# Patient Record
Sex: Male | Born: 1960 | Race: Black or African American | Hispanic: No | Marital: Single | Smoking: Current every day smoker
Health system: Southern US, Community
[De-identification: ages and names within clinical notes are randomized; demographics above are authoritative.]

## PROBLEM LIST (undated history)

## (undated) DIAGNOSIS — Z21 Asymptomatic human immunodeficiency virus [HIV] infection status: Secondary | ICD-10-CM

## (undated) DIAGNOSIS — B20 Human immunodeficiency virus [HIV] disease: Secondary | ICD-10-CM

## (undated) DIAGNOSIS — I10 Essential (primary) hypertension: Secondary | ICD-10-CM

---

## 1998-02-05 ENCOUNTER — Encounter: Admission: RE | Admit: 1998-02-05 | Discharge: 1998-02-05 | Payer: Self-pay | Admitting: Internal Medicine

## 1998-02-19 ENCOUNTER — Encounter: Admission: RE | Admit: 1998-02-19 | Discharge: 1998-02-19 | Payer: Self-pay | Admitting: Internal Medicine

## 1998-03-05 ENCOUNTER — Encounter: Admission: RE | Admit: 1998-03-05 | Discharge: 1998-03-05 | Payer: Self-pay | Admitting: Internal Medicine

## 1998-08-27 ENCOUNTER — Encounter: Admission: RE | Admit: 1998-08-27 | Discharge: 1998-08-27 | Payer: Self-pay | Admitting: Internal Medicine

## 1999-11-19 ENCOUNTER — Encounter: Admission: RE | Admit: 1999-11-19 | Discharge: 1999-11-19 | Payer: Self-pay | Admitting: Hematology and Oncology

## 1999-11-19 ENCOUNTER — Ambulatory Visit (HOSPITAL_COMMUNITY): Admission: RE | Admit: 1999-11-19 | Discharge: 1999-11-19 | Payer: Self-pay | Admitting: Hematology and Oncology

## 1999-11-19 ENCOUNTER — Encounter: Payer: Self-pay | Admitting: Hematology and Oncology

## 2000-03-31 ENCOUNTER — Encounter: Admission: RE | Admit: 2000-03-31 | Discharge: 2000-03-31 | Payer: Self-pay | Admitting: Hematology and Oncology

## 2000-03-31 ENCOUNTER — Ambulatory Visit (HOSPITAL_COMMUNITY): Admission: RE | Admit: 2000-03-31 | Discharge: 2000-03-31 | Payer: Self-pay | Admitting: Hematology and Oncology

## 2000-05-08 ENCOUNTER — Encounter: Admission: RE | Admit: 2000-05-08 | Discharge: 2000-05-08 | Payer: Self-pay | Admitting: Internal Medicine

## 2001-04-13 ENCOUNTER — Emergency Department (HOSPITAL_COMMUNITY): Admission: EM | Admit: 2001-04-13 | Discharge: 2001-04-13 | Payer: Self-pay | Admitting: Emergency Medicine

## 2001-05-22 ENCOUNTER — Emergency Department (HOSPITAL_COMMUNITY): Admission: EM | Admit: 2001-05-22 | Discharge: 2001-05-22 | Payer: Self-pay | Admitting: Emergency Medicine

## 2001-08-20 ENCOUNTER — Encounter: Payer: Self-pay | Admitting: Emergency Medicine

## 2001-08-20 ENCOUNTER — Emergency Department (HOSPITAL_COMMUNITY): Admission: EM | Admit: 2001-08-20 | Discharge: 2001-08-20 | Payer: Self-pay | Admitting: Emergency Medicine

## 2001-08-26 ENCOUNTER — Encounter: Admission: RE | Admit: 2001-08-26 | Discharge: 2001-08-26 | Payer: Self-pay | Admitting: Internal Medicine

## 2001-09-24 ENCOUNTER — Encounter: Admission: RE | Admit: 2001-09-24 | Discharge: 2001-09-24 | Payer: Self-pay | Admitting: Internal Medicine

## 2001-09-25 ENCOUNTER — Encounter: Payer: Self-pay | Admitting: Emergency Medicine

## 2001-09-25 ENCOUNTER — Emergency Department (HOSPITAL_COMMUNITY): Admission: EM | Admit: 2001-09-25 | Discharge: 2001-09-25 | Payer: Self-pay

## 2001-10-20 ENCOUNTER — Ambulatory Visit (HOSPITAL_COMMUNITY): Admission: RE | Admit: 2001-10-20 | Discharge: 2001-10-20 | Payer: Self-pay | Admitting: Internal Medicine

## 2001-10-20 ENCOUNTER — Encounter: Admission: RE | Admit: 2001-10-20 | Discharge: 2001-10-20 | Payer: Self-pay

## 2007-09-10 ENCOUNTER — Emergency Department (HOSPITAL_COMMUNITY): Admission: EM | Admit: 2007-09-10 | Discharge: 2007-09-10 | Payer: Self-pay | Admitting: Family Medicine

## 2007-09-16 ENCOUNTER — Ambulatory Visit: Payer: Self-pay | Admitting: Internal Medicine

## 2007-09-16 ENCOUNTER — Inpatient Hospital Stay (HOSPITAL_COMMUNITY): Admission: EM | Admit: 2007-09-16 | Discharge: 2007-09-25 | Payer: Self-pay | Admitting: Emergency Medicine

## 2007-09-29 ENCOUNTER — Ambulatory Visit: Payer: Self-pay | Admitting: Internal Medicine

## 2007-09-29 DIAGNOSIS — B59 Pneumocystosis: Secondary | ICD-10-CM

## 2007-09-29 DIAGNOSIS — B20 Human immunodeficiency virus [HIV] disease: Secondary | ICD-10-CM

## 2007-09-29 DIAGNOSIS — I059 Rheumatic mitral valve disease, unspecified: Secondary | ICD-10-CM | POA: Insufficient documentation

## 2007-09-29 DIAGNOSIS — Z8709 Personal history of other diseases of the respiratory system: Secondary | ICD-10-CM | POA: Insufficient documentation

## 2007-10-05 ENCOUNTER — Telehealth: Payer: Self-pay | Admitting: Internal Medicine

## 2007-10-06 ENCOUNTER — Inpatient Hospital Stay (HOSPITAL_COMMUNITY): Admission: EM | Admit: 2007-10-06 | Discharge: 2007-10-11 | Payer: Self-pay | Admitting: Emergency Medicine

## 2007-10-11 ENCOUNTER — Telehealth: Payer: Self-pay | Admitting: Internal Medicine

## 2007-11-01 ENCOUNTER — Telehealth: Payer: Self-pay | Admitting: Internal Medicine

## 2007-11-03 ENCOUNTER — Encounter: Admission: RE | Admit: 2007-11-03 | Discharge: 2007-11-03 | Payer: Self-pay | Admitting: Internal Medicine

## 2007-11-03 ENCOUNTER — Ambulatory Visit: Payer: Self-pay | Admitting: Internal Medicine

## 2007-11-03 LAB — CONVERTED CEMR LAB
ALT: 23 units/L (ref 0–53)
Albumin: 3.5 g/dL (ref 3.5–5.2)
Alkaline Phosphatase: 67 units/L (ref 39–117)
BUN: 12 mg/dL (ref 6–23)
Basophils Absolute: 0 10*3/uL (ref 0.0–0.1)
Eosinophils Absolute: 0.6 10*3/uL (ref 0.0–0.7)
Glucose, Bld: 90 mg/dL (ref 70–99)
HCT: 37.7 % — ABNORMAL LOW (ref 39.0–52.0)
HIV-1 RNA Quant, Log: 2.36 — ABNORMAL HIGH (ref <1.70)
Hemoglobin: 12.4 g/dL — ABNORMAL LOW (ref 13.0–17.0)
Lymphocytes Relative: 28 % (ref 12–46)
MCHC: 32.9 g/dL (ref 30.0–36.0)
MCV: 81.3 fL (ref 78.0–100.0)
Neutro Abs: 3.7 10*3/uL (ref 1.7–7.7)
Neutrophils Relative %: 54 % (ref 43–77)
Platelets: 502 10*3/uL — ABNORMAL HIGH (ref 150–400)
RBC: 4.64 M/uL (ref 4.22–5.81)
RDW: 20.2 % — ABNORMAL HIGH (ref 11.5–15.5)
WBC: 6.8 10*3/uL (ref 4.0–10.5)

## 2007-11-15 ENCOUNTER — Telehealth: Payer: Self-pay | Admitting: Internal Medicine

## 2007-11-17 ENCOUNTER — Ambulatory Visit: Payer: Self-pay | Admitting: Internal Medicine

## 2007-11-18 ENCOUNTER — Telehealth: Payer: Self-pay | Admitting: Internal Medicine

## 2007-12-02 ENCOUNTER — Encounter: Payer: Self-pay | Admitting: Internal Medicine

## 2007-12-21 ENCOUNTER — Encounter: Payer: Self-pay | Admitting: Internal Medicine

## 2008-08-31 IMAGING — CR DG CHEST 1V PORT
1 series · 1 of 1 positions shown · non-contrast
Comparison: Portable chest x-ray 09/16/2007.

CLINICAL DATA: Immunocompromised patient with recent history of pneumonia.

PORTABLE CHEST - 1 VIEW  [DATE]/9006 0270 hours:

[view not recorded]
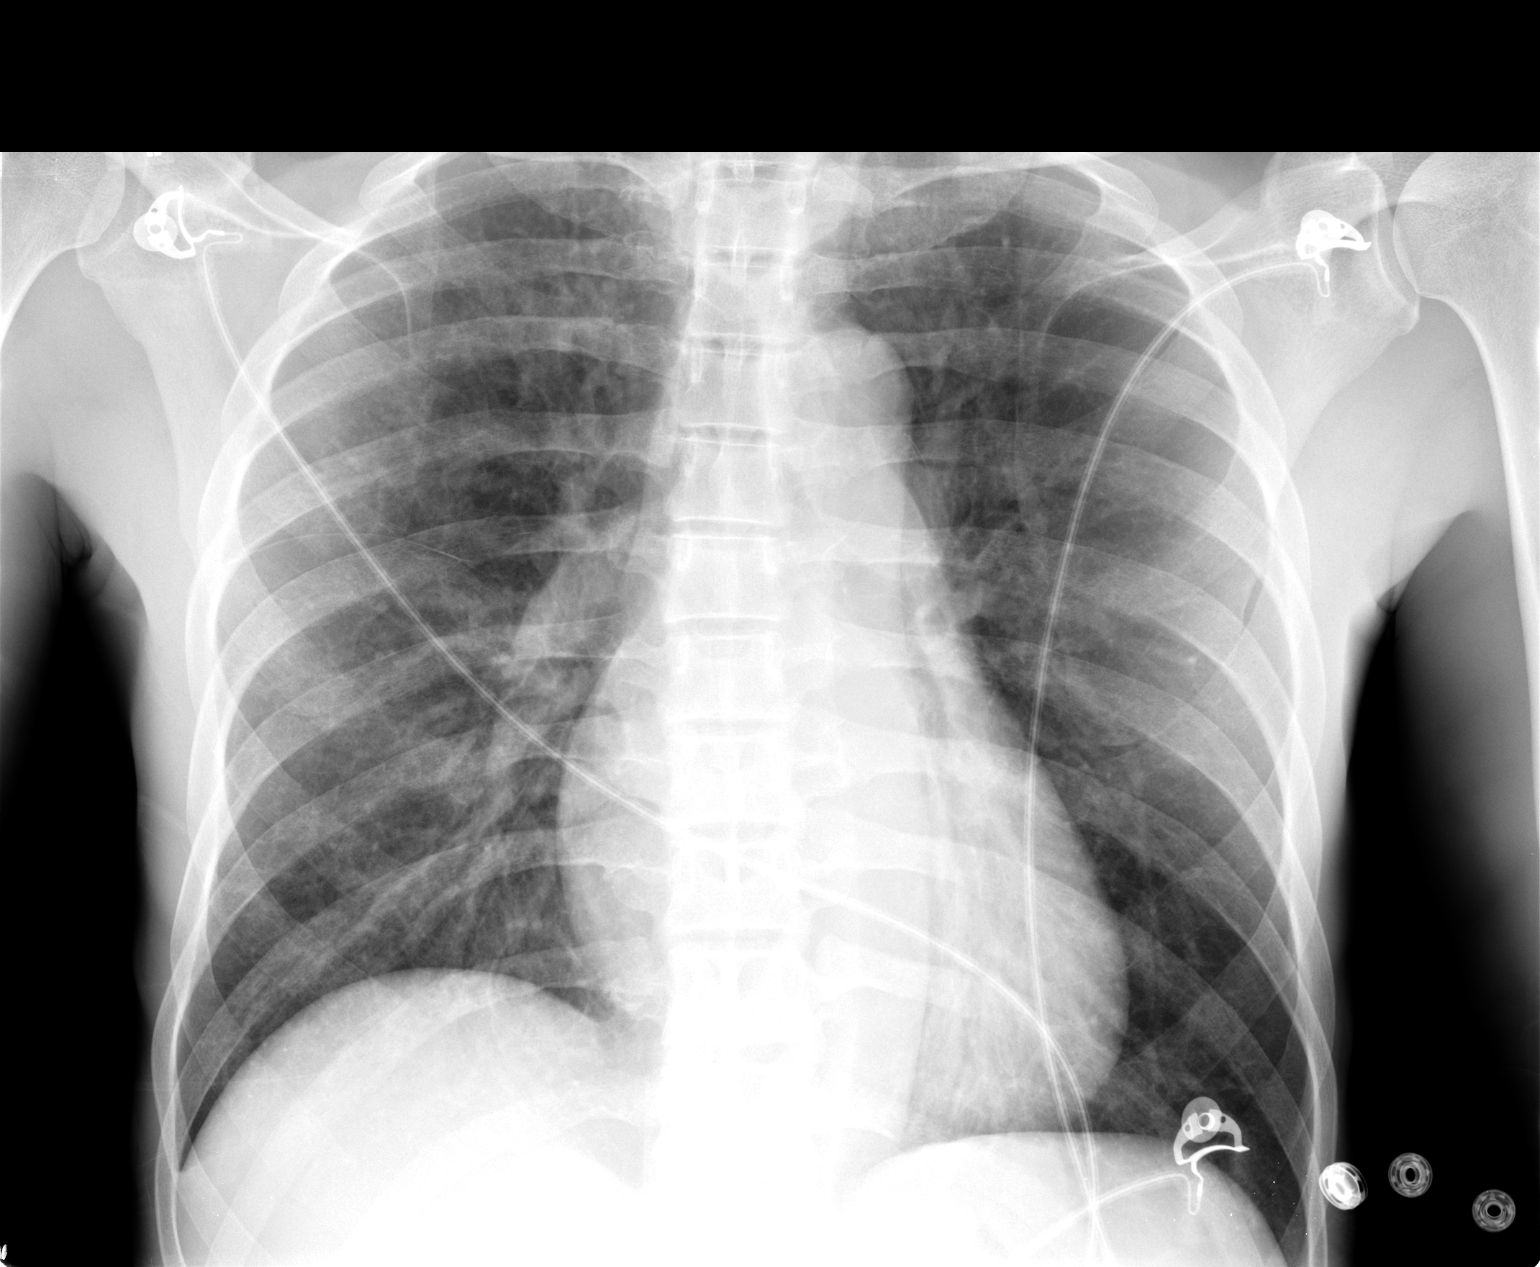

[1 of 1 positions shown; findings below may reference images not displayed]

FINDINGS: Interval resolution of the patchy pneumonia at the right lung base.
Heart size upper normal to slightly enlarged but stable. Pulmonary parenchyma
now clear. No visible pleural effusions.
IMPRESSION: Resolution of right basilar pneumonia since the examination 8 days ago. No acute
cardiopulmonary disease currently.

## 2008-09-12 IMAGING — CR DG ABDOMEN 2V
2 series · 2 of 2 positions shown · non-contrast
Comparison: None.

CLINICAL DATA: Abdominal pain and nausea.  Constipation. 
 ABDOMEN- 2 VIEW:

[w abdomen upright]
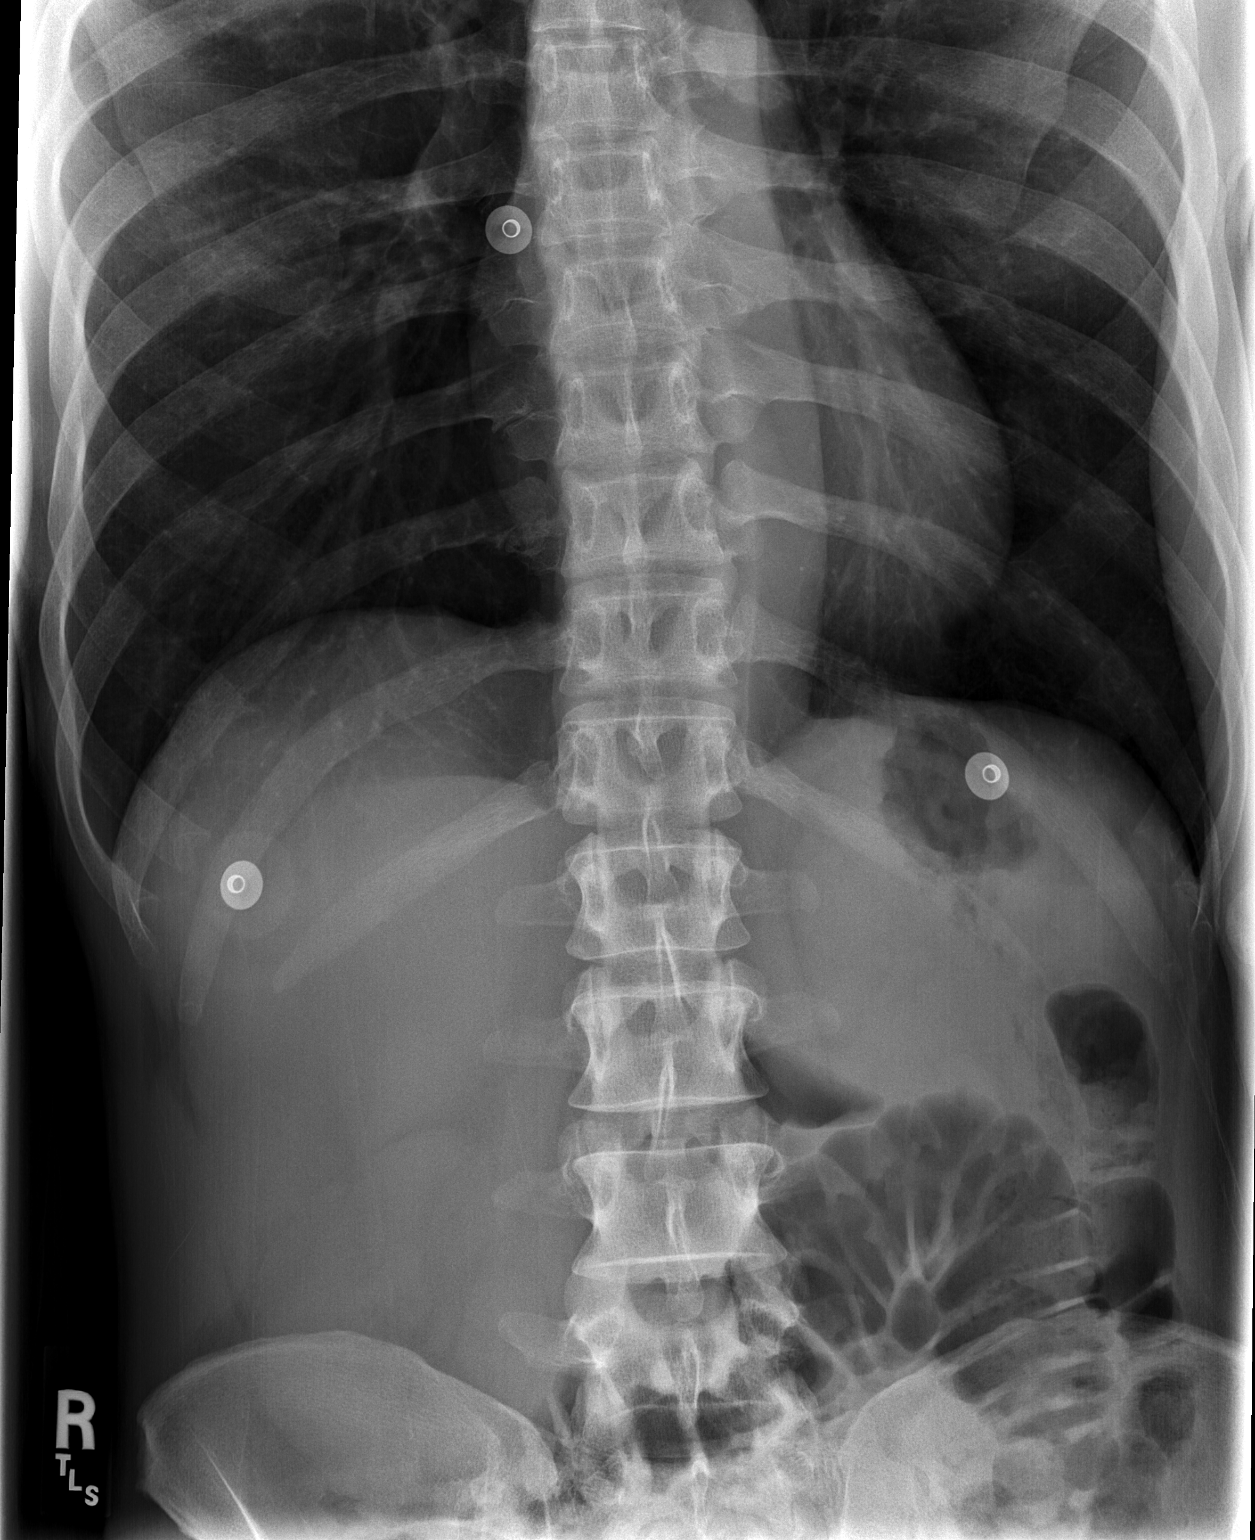

[t abdomen supine]
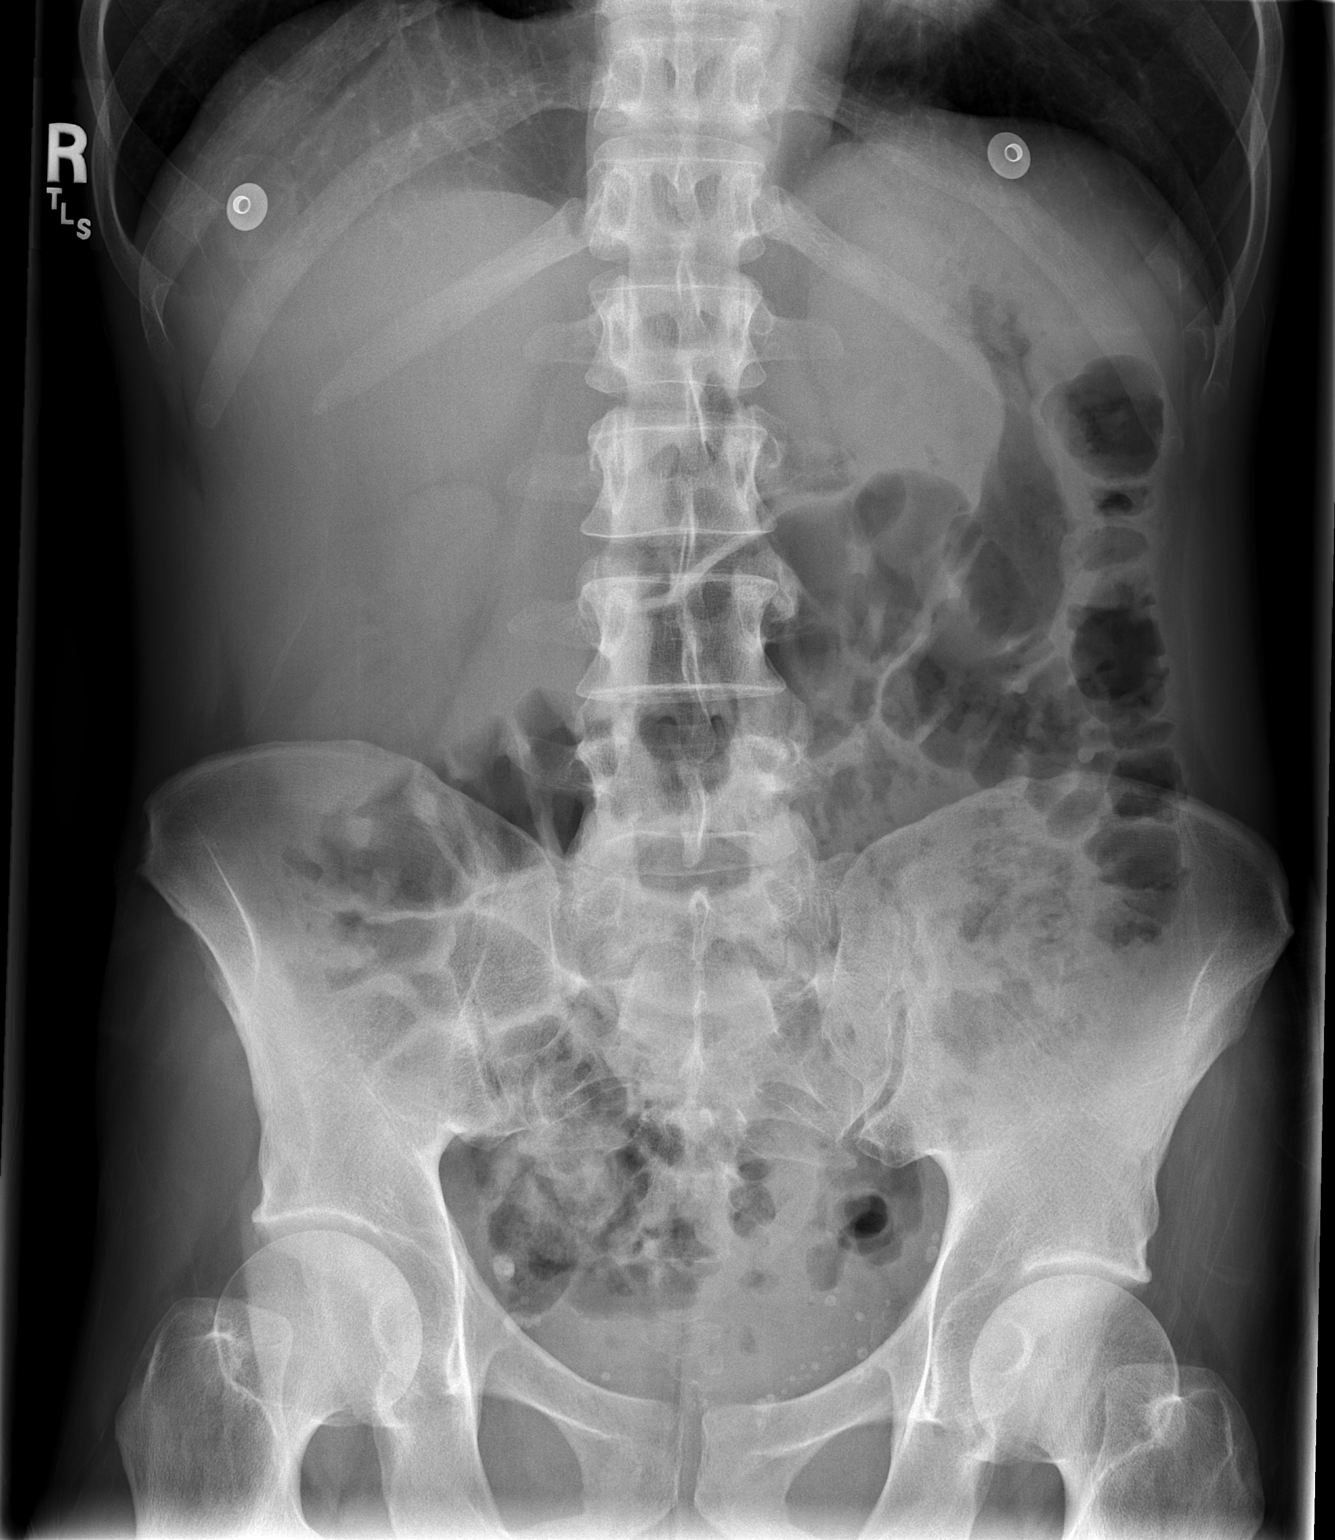

[2 of 2 positions shown; findings below may reference images not displayed]

FINDINGS: The bowel gas pattern is normal.  There does not appear to be an excessive amount of stool in the colon.  Bilateral pelvic calcifications are typical of phleboliths.  There is no evidence of free intraperitoneal air.  The lung bases are clear.
IMPRESSION: No acute abdominal findings. No radiographic evidence of significant constipation.

## 2009-07-31 ENCOUNTER — Ambulatory Visit: Payer: Self-pay | Admitting: Infectious Disease

## 2009-07-31 ENCOUNTER — Inpatient Hospital Stay (HOSPITAL_COMMUNITY): Admission: EM | Admit: 2009-07-31 | Discharge: 2009-08-05 | Payer: Self-pay | Admitting: Emergency Medicine

## 2011-01-05 ENCOUNTER — Inpatient Hospital Stay (INDEPENDENT_AMBULATORY_CARE_PROVIDER_SITE_OTHER)
Admission: RE | Admit: 2011-01-05 | Discharge: 2011-01-05 | Disposition: A | Discharge status: Home / Self Care | Payer: Medicare Other | Source: Ambulatory Visit | Attending: Family Medicine | Admitting: Family Medicine

## 2011-01-05 DIAGNOSIS — N390 Urinary tract infection, site not specified: Secondary | ICD-10-CM

## 2011-01-05 LAB — POCT URINALYSIS DIP (DEVICE)
Glucose, UA: NEGATIVE mg/dL
Ketones, ur: NEGATIVE mg/dL
Nitrite: NEGATIVE
Protein, ur: >=300 mg/dL — AB

## 2011-01-16 LAB — CBC
HCT: 38.7 % — ABNORMAL LOW (ref 39.0–52.0)
HCT: 39.1 % (ref 39.0–52.0)
HCT: 44 % (ref 39.0–52.0)
Hemoglobin: 12.7 g/dL — ABNORMAL LOW (ref 13.0–17.0)
Hemoglobin: 13.1 g/dL (ref 13.0–17.0)
Hemoglobin: 13.2 g/dL (ref 13.0–17.0)
Hemoglobin: 15.1 g/dL (ref 13.0–17.0)
Hemoglobin: 15.4 g/dL (ref 13.0–17.0)
MCHC: 33.7 g/dL (ref 30.0–36.0)
MCHC: 33.8 g/dL (ref 30.0–36.0)
MCHC: 34.4 g/dL (ref 30.0–36.0)
MCV: 90.3 fL (ref 78.0–100.0)
MCV: 90.6 fL (ref 78.0–100.0)
MCV: 90.7 fL (ref 78.0–100.0)
MCV: 90.8 fL (ref 78.0–100.0)
Platelets: 173 10*3/uL (ref 150–400)
Platelets: 209 K/uL (ref 150–400)
Platelets: 286 10*3/uL (ref 150–400)
RBC: 4.09 MIL/uL — ABNORMAL LOW (ref 4.22–5.81)
RBC: 4.13 MIL/uL — ABNORMAL LOW (ref 4.22–5.81)
RBC: 4.28 MIL/uL (ref 4.22–5.81)
RBC: 4.34 MIL/uL (ref 4.22–5.81)
RBC: 4.41 MIL/uL (ref 4.22–5.81)
RBC: 4.87 MIL/uL (ref 4.22–5.81)
RDW: 14 % (ref 11.5–15.5)
RDW: 14.1 % (ref 11.5–15.5)
RDW: 14.4 % (ref 11.5–15.5)
RDW: 14.4 % (ref 11.5–15.5)
WBC: 1.7 10*3/uL — ABNORMAL LOW (ref 4.0–10.5)
WBC: 2.1 10*3/uL — ABNORMAL LOW (ref 4.0–10.5)
WBC: 2.3 10*3/uL — ABNORMAL LOW (ref 4.0–10.5)
WBC: 3 K/uL — ABNORMAL LOW (ref 4.0–10.5)

## 2011-01-16 LAB — BASIC METABOLIC PANEL
BUN: 18 mg/dL (ref 6–23)
CO2: 21 mEq/L (ref 19–32)
CO2: 21 mEq/L (ref 19–32)
CO2: 22 mEq/L (ref 19–32)
CO2: 24 mEq/L (ref 19–32)
Calcium: 7.8 mg/dL — ABNORMAL LOW (ref 8.4–10.5)
Calcium: 8.5 mg/dL (ref 8.4–10.5)
Calcium: 8.5 mg/dL (ref 8.4–10.5)
Calcium: 8.5 mg/dL (ref 8.4–10.5)
Chloride: 100 mEq/L (ref 96–112)
Chloride: 101 mEq/L (ref 96–112)
Chloride: 106 mEq/L (ref 96–112)
Chloride: 99 mEq/L (ref 96–112)
Creatinine, Ser: 0.96 mg/dL (ref 0.4–1.5)
Creatinine, Ser: 0.99 mg/dL (ref 0.4–1.5)
Creatinine, Ser: 1.49 mg/dL (ref 0.4–1.5)
GFR calc Af Amer: >60 mL/min (ref >60)
GFR calc Af Amer: >60 mL/min (ref >60)
GFR calc Af Amer: >60 mL/min (ref >60)
GFR calc Af Amer: >60 mL/min (ref >60)
GFR calc Af Amer: >60 mL/min (ref >60)
GFR calc non Af Amer: 51 mL/min — ABNORMAL LOW (ref >60)
GFR calc non Af Amer: >60 mL/min (ref >60)
GFR calc non Af Amer: >60 mL/min (ref >60)
Glucose, Bld: 100 mg/dL — ABNORMAL HIGH (ref 70–99)
Glucose, Bld: 98 mg/dL (ref 70–99)
Potassium: 4.1 mEq/L (ref 3.5–5.1)
Potassium: 4.2 mEq/L (ref 3.5–5.1)
Potassium: 4.2 mEq/L (ref 3.5–5.1)
Potassium: 4.3 mEq/L (ref 3.5–5.1)
Potassium: 4.3 mEq/L (ref 3.5–5.1)
Potassium: 4.6 mEq/L (ref 3.5–5.1)
Sodium: 132 mEq/L — ABNORMAL LOW (ref 135–145)
Sodium: 133 mEq/L — ABNORMAL LOW (ref 135–145)
Sodium: 133 mEq/L — ABNORMAL LOW (ref 135–145)
Sodium: 137 mEq/L (ref 135–145)

## 2011-01-16 LAB — COMPREHENSIVE METABOLIC PANEL
ALT: 13 U/L (ref 0–53)
ALT: 14 U/L (ref 0–53)
AST: 19 U/L (ref 0–37)
AST: 26 U/L (ref 0–37)
Albumin: 3 g/dL — ABNORMAL LOW (ref 3.5–5.2)
Albumin: 3.6 g/dL (ref 3.5–5.2)
Alkaline Phosphatase: 70 U/L (ref 39–117)
BUN: 16 mg/dL (ref 6–23)
CO2: 24 mEq/L (ref 19–32)
Calcium: 7.7 mg/dL — ABNORMAL LOW (ref 8.4–10.5)
Chloride: 102 mEq/L (ref 96–112)
Chloride: 103 mEq/L (ref 96–112)
Creatinine, Ser: 1.21 mg/dL (ref 0.4–1.5)
Creatinine, Ser: 1.34 mg/dL (ref 0.4–1.5)
GFR calc Af Amer: >60 mL/min (ref >60)
GFR calc Af Amer: >60 mL/min (ref >60)
GFR calc non Af Amer: 57 mL/min — ABNORMAL LOW (ref >60)
Glucose, Bld: 96 mg/dL (ref 70–99)
Potassium: 4.3 mEq/L (ref 3.5–5.1)
Sodium: 133 mEq/L — ABNORMAL LOW (ref 135–145)
Sodium: 136 mEq/L (ref 135–145)
Total Bilirubin: 0.2 mg/dL — ABNORMAL LOW (ref 0.3–1.2)
Total Bilirubin: 0.2 mg/dL — ABNORMAL LOW (ref 0.3–1.2)
Total Protein: 6 g/dL (ref 6.0–8.3)

## 2011-01-16 LAB — PATHOLOGIST SMEAR REVIEW

## 2011-01-16 LAB — TOXOPLASMA GONDII ANTIBODY, IGG: Toxoplasma IgG Ratio: <0.5 IU/mL

## 2011-01-16 LAB — LACTIC ACID, PLASMA: Lactic Acid, Venous: 1 mmol/L (ref 0.5–2.2)

## 2011-01-16 LAB — DIFFERENTIAL
Basophils Absolute: 0 10*3/uL (ref 0.0–0.1)
Basophils Absolute: 0 10*3/uL (ref 0.0–0.1)
Basophils Absolute: 0 K/uL (ref 0.0–0.1)
Basophils Relative: 1 % (ref 0–1)
Basophils Relative: 1 % (ref 0–1)
Basophils Relative: 1 % (ref 0–1)
Eosinophils Absolute: 0 10*3/uL (ref 0.0–0.7)
Eosinophils Absolute: 0 10*3/uL (ref 0.0–0.7)
Eosinophils Absolute: 0.1 10*3/uL (ref 0.0–0.7)
Eosinophils Relative: 0 % (ref 0–5)
Eosinophils Relative: 1 % (ref 0–5)
Eosinophils Relative: 1 % (ref 0–5)
Eosinophils Relative: 2 % (ref 0–5)
Eosinophils Relative: 3 % (ref 0–5)
Lymphocytes Relative: 26 % (ref 12–46)
Lymphocytes Relative: 46 % (ref 12–46)
Lymphocytes Relative: 50 % — ABNORMAL HIGH (ref 12–46)
Lymphocytes Relative: 54 % — ABNORMAL HIGH (ref 12–46)
Lymphs Abs: 0.8 K/uL (ref 0.7–4.0)
Lymphs Abs: 0.9 10*3/uL (ref 0.7–4.0)
Lymphs Abs: 1 10*3/uL (ref 0.7–4.0)
Lymphs Abs: 1.6 10*3/uL (ref 0.7–4.0)
Monocytes Absolute: 0.1 10*3/uL (ref 0.1–1.0)
Monocytes Absolute: 0.2 10*3/uL (ref 0.1–1.0)
Monocytes Absolute: 0.2 10*3/uL (ref 0.1–1.0)
Monocytes Absolute: 0.3 10*3/uL (ref 0.1–1.0)
Monocytes Relative: 10 % (ref 3–12)
Monocytes Relative: 10 % (ref 3–12)
Monocytes Relative: 10 % (ref 3–12)
Monocytes Relative: 5 % (ref 3–12)
Monocytes Relative: 8 % (ref 3–12)
Monocytes Relative: 9 % (ref 3–12)
Neutro Abs: 0.7 10*3/uL — ABNORMAL LOW (ref 1.7–7.7)
Neutro Abs: 0.8 10*3/uL — ABNORMAL LOW (ref 1.7–7.7)
Neutro Abs: 1.9 10*3/uL (ref 1.7–7.7)
Neutrophils Relative %: 32 % — ABNORMAL LOW (ref 43–77)
Neutrophils Relative %: 38 % — ABNORMAL LOW (ref 43–77)
Neutrophils Relative %: 42 % — ABNORMAL LOW (ref 43–77)
Neutrophils Relative %: 64 % (ref 43–77)

## 2011-01-16 LAB — VITAMIN D 1,25 DIHYDROXY
Vitamin D 1, 25 (OH)2 Total: 75 pg/mL — ABNORMAL HIGH (ref 18–72)
Vitamin D2 1, 25 (OH)2: <8 pg/mL
Vitamin D3 1, 25 (OH)2: 75 pg/mL

## 2011-01-16 LAB — MAGNESIUM
Magnesium: 1.9 mg/dL (ref 1.5–2.5)
Magnesium: 2.3 mg/dL (ref 1.5–2.5)

## 2011-01-16 LAB — HEPATITIS PANEL, ACUTE: HCV Ab: NEGATIVE

## 2011-01-16 LAB — CULTURE, BLOOD (ROUTINE X 2)
Culture: NO GROWTH
Culture: NO GROWTH

## 2011-01-16 LAB — URINALYSIS, ROUTINE W REFLEX MICROSCOPIC
Hgb urine dipstick: NEGATIVE
Ketones, ur: 15 mg/dL — AB
Leukocytes, UA: NEGATIVE
Nitrite: NEGATIVE
Protein, ur: 30 mg/dL — AB
Urobilinogen, UA: 0.2 mg/dL (ref 0.0–1.0)

## 2011-01-16 LAB — URINE CULTURE
Colony Count: NO GROWTH
Culture: NO GROWTH

## 2011-01-16 LAB — CSF CELL COUNT WITH DIFFERENTIAL
RBC Count, CSF: 0 /mm3
WBC, CSF: 2 /mm3 (ref 0–5)

## 2011-01-16 LAB — BASIC METABOLIC PANEL WITH GFR
BUN: 18 mg/dL (ref 6–23)
Glucose, Bld: 98 mg/dL (ref 70–99)

## 2011-01-16 LAB — CSF CULTURE W GRAM STAIN
Culture: NO GROWTH
Gram Stain: NONE SEEN

## 2011-01-16 LAB — TESTOSTERONE: Testosterone: 165.8 ng/dL — ABNORMAL LOW (ref 350–890)

## 2011-01-16 LAB — HIV-1 RNA QUANT-NO REFLEX-BLD: HIV 1 RNA Quant: <48 copies/mL (ref <48)

## 2011-01-16 LAB — URINE MICROSCOPIC-ADD ON

## 2011-01-16 LAB — LACTATE DEHYDROGENASE, ISOENZYMES
LDH 1: 17 % (ref 19–38)
LDH 2: 28 % — ABNORMAL LOW (ref 30–43)
LDH 3: 29 % — ABNORMAL HIGH (ref 16–26)

## 2011-01-16 LAB — TSH: TSH: 3.209 u[IU]/mL (ref 0.350–4.500)

## 2011-01-16 LAB — HSV PCR: HSV 2 , PCR: NOT DETECTED

## 2011-01-16 LAB — PHOSPHORUS: Phosphorus: 3.9 mg/dL (ref 2.3–4.6)

## 2011-01-16 LAB — PROTEIN AND GLUCOSE, CSF: Total  Protein, CSF: 35 mg/dL (ref 15–45)

## 2011-01-16 LAB — T-HELPER CELLS (CD4) COUNT (NOT AT ARMC): CD4 % Helper T Cell: 18 % — ABNORMAL LOW (ref 33–55)

## 2011-01-16 LAB — SEDIMENTATION RATE: Sed Rate: 14 mm/hr (ref 0–16)

## 2011-01-16 LAB — T4, FREE: Free T4: 0.69 ng/dL — ABNORMAL LOW (ref 0.80–1.80)

## 2011-02-25 NOTE — H&P (Signed)
NAME:  Eric Gardner, Eric Gardner                ACCOUNT NO.:  [id number]   MEDICAL RECORD NO.:  [id number]          PATIENT TYPE:  INP   LOCATION:  5522                         FACILITY:  [facility]   PHYSICIAN:  Wilson Singer, M.D.DATE OF BIRTH:  17-Jul-1961   DATE OF ADMISSION:  10/06/2007  DATE OF DISCHARGE:                              HISTORY & PHYSICAL   This is a very pleasant 50 year old African American gentleman who has a  background history of HIV disease and was recently hospitalized for  presumed a PCP right lower lobe pneumonia and was discharged home on  Bactrim DS and now presents with about a one week history of generalized  abdominal pain, anorexia, constipation for the last three to four days.  He denies any rectal bleeding, hematemesis.  There are no urinary  symptoms.  He was started on HIV medications I believe on his last  admission.   PAST MEDICAL HISTORY:  HIV diagnosed approximately 10 years ago.  History of anemia probably related to his HIV.  History of mitral valve  prolapse.  History of recent presumed PCP pneumonia, although sputum  actually was negative.   SOCIAL HISTORY:  Single, lives with his family.  He is a nonsmoker, does  not drink alcohol.  He is currently unemployed.   PAST SURGICAL HISTORY:  None.   MEDICATIONS:  Atripla one tablet daily, Bactrim DS two tablets t.i.d.   ALLERGIES:  None   FAMILY HISTORY:  Noncontributory.   REVIEW OF SYSTEMS:  Apart from symptoms mentioned above there are no  other symptoms with all review of systems reviewed.   PHYSICAL EXAMINATION:  VITAL SIGNS:  Temperature 97.4, blood pressure  121/88, pulse 80, respirations 12-14, pulse ox 97% on room air.  GENERAL:  He looks somewhat clinically dehydrated.  CARDIOVASCULAR:  Heart sounds are present and normal without murmurs.  RESPIRATORY:  Lung fields are clear.  ABDOMEN:  Soft and voluntary guarding.  There is minimal tenderness and  there is no rebound tenderness.  Bowel  sounds are heard but are scanty.  NEUROLOGICAL:  He is alert and oriented with no focal neurologic signs.   INVESTIGATIONS:  Sodium 126, potassium 6.1, bicarbonate 25, BUN 29,  creatinine 1.61, hemoglobin 16.1, white blood cell count 4.4, platelets  246.   IMPRESSION:  1. Abdominal pain, unclear etiology, possibly related to constipation.  2. Renal failure secondary to hypovolemia and dehydration.  3. Hyponatremia secondary to #2.  4. Human immunodeficiency virus disease, on treatment.  5. Recent right lower lobe pneumonia, on Bactrim.   PLAN:  1. Admit.  2. Intravenous fluids.  3. CT of the abdomen for further evaluation of the abdominal pain.   Further recommendations will depend on the patient's hospital progress.      Wilson Singer, M.D.  Electronically Signed     NCG/MEDQ  D:  10/06/2007  T:  10/07/2007  Job:  [id number]

## 2011-02-25 NOTE — Discharge Summary (Signed)
NAMEWENTWORTH, EDELEN                ACCOUNT NO.:  [id number]   MEDICAL RECORD NO.:  [id number]          PATIENT TYPE:  INP   LOCATION:  6713                         FACILITY:  [facility]   PHYSICIAN:  Michaelyn Barter, M.D. DATE OF BIRTH:  August 15, 1961   DATE OF ADMISSION:  09/16/2007  DATE OF DISCHARGE:                               DISCHARGE SUMMARY   PRIMARY CARE PHYSICIAN:  Unassigned.   FINAL DIAGNOSES:  1. Right lower lobe pneumonia.  2. History of human immunodeficiency virus positive/AIDS.  3. Thrush.  4. Weakness, deconditioning.  5. Hyperkalemia.  6. Anemia.   PROCEDURE:  Chest x-ray completed September 16, 2007.   CONSULTATIONS:  Infectious disease with Dr. Cliffton Asters.   HISTORY OF PRESENT ILLNESS:  Mr. Brannan is a 50 year old gentleman with  a past medical history of HIV who arrived complaining of progressive  shortness of breath as well as fever that had occurred over the 3 weeks  leading up to his admission.  He also complained of chronic diarrhea.  He was seen in the emergency department on Saturday and was diagnosed  with an upper respiratory tract infection.  Amoxicillin was started.  He  indicates that his symptoms did not improve but progressed.   For past medical history please see that dictated by Dr. Oneita Hurt.   HOSPITAL COURSE:  1. Pneumonia.  The patient had a chest x-ray completed on September 16, 2007 which revealed patchy opacity at the right lung base, possibly      early pneumonia.  He was started on Rocephin 2 grams IV daily as      well as azithromycin 500 mg IV daily.  Because of his history of      HIV and his presentation, Bactrim DS was initiated out of concerns      of PCP.  Over the course of the patient's hospitalization his      breathing has improved although he still complains of a significant      amount of shortness of breath with exertion.  Infectious disease      was consulted and Dr. Cliffton Asters has been following the patient.      The patient has been started on prednisone and after 6 days of IV      Rocephin and azithromycin, both IV Rocephin and azithromycin have      been discontinued.  The patient however remains on Septra DS two      tablets p.o. t.i.d. out of concern that the patient may be      suffering from Pneumocystis carinii pneumonia.  Dr. Orvan Falconer has      indicated that he believes that the patient may have PCP.  Sputum      has been sent for PCP and determined to be negative however.  2. Human immunodeficiency virus positive/AIDS.  The patient had an HIV-      1 RNA quant completed, the result of which was 121,000. Likewise      and HIV-1 RNA quant, LOG was 5.08.  The patient has been started on  MAC prophylaxis with azithromycin 1200 mg every Wednesday.  3. Oral thrush. Diflucan has been provided.  4. Severe weakness, deconditioning.  The patient appears to be      cachectic in his appearance.  His oral intake has been decreased.      I suspect the patient's weakness and deconditioning are      multifactorial, including the pneumonia, his overall decreased oral      intake, as well as his cachectic appearance.  Physical therapy plus      occupational therapy may need to be consulted prior to patient's      discharge from the hospital.  The patient also has been limited in      his activity level secondary to becoming significantly short of      breath as he tries to exert himself.  5. Hyperkalemia.  Patient's potassium level was noted to be 5.2 on      September 22, 2007.  For this, Kayexalate was given.  The patient's      hyperkalemia resolved.  6. Mild anemia.  This has simply been monitored over the course of his      hospitalization.   This brings me up to September 24, 2007.  I suspect that the patient may  be discharged from the hospital soon.      Michaelyn Barter, M.D.  Electronically Signed     OR/MEDQ  D:  09/23/2007  T:  09/23/2007  Job:  [id number]

## 2011-02-25 NOTE — Discharge Summary (Signed)
NAMEGABRIELE, LOVELAND                ACCOUNT NO.:  [id number]   MEDICAL RECORD NO.:  [id number]          PATIENT TYPE:  INP   LOCATION:  5522                         FACILITY:  [facility]   PHYSICIAN:  Herbie Saxon, MDDATE OF BIRTH:  12-05-1960   DATE OF ADMISSION:  10/06/2007  DATE OF DISCHARGE:  10/11/2007                               DISCHARGE SUMMARY   DISCHARGE DIAGNOSES:  1. Human immunodeficiency virus (HIV) positive.  2. Dehydration.  3. Acute renal failure, resolved.  4. Hyponatremia, improved.  5. Recent treatment for Pneumocystis carinii (PCP) pneumonia.  6. Constipation, resolved.  7. Protein-calorie malnutrition.  8. History of oral Candidiasis.  9. Recent treatment for right lower lobe pneumonia.   RADIOLOGY:  The chest x-ray on October 06, 2007, shows mild apical  pleural thickening.  The CT abdomen of October 07, 2007, shows no acute  findings in the abdomen, moderate pulmonary cysts, and ponderable  emphysema.  Abdominal x-ray of December 24th, which showed no acute  abdominal findings.   HOSPITAL COURSE:  This 50 year old, African-American gentleman presented  to the emergency room with generalized abdominal pain, anorexia, nausea,  constipation for the last three days, no rectal bleeding, no  hematemesis.  On evaluation, the patient was noticed to be acutely  dehydrated with severe hyponatremia with a sodium level of 127 at  inception.  His stool workup was negative, and the C. diff toxin was  also negative.  He was started on IV fluid hydration with normal saline  and was also placed on Diflucan.  His nausea has improved, and the  patient has no more constipation.  He complains of some right arm pain,  which is relieved with p.r.n. Percocet.   The patient did have diarrhea, which has resolved in the last 48 hours,  and this was present at inception.   CONDITION ON DISCHARGE:  Stable.   DIET:  Regular.   ACTIVITY:  No restrictions; the patient is  to increase it slowly as  tolerated.   FOLLOWUP:  Dr. Drue Second in [facility] to have a CD-4  count, and the patient will have a repeat BNP at that time.   MEDICATIONS ON DISCHARGE:  1. Bactrim Double Strength 2 tablets b.i.d. for one week.  2. Diflucan 100 mg p.o. daily.  3. Reglan 10 mg q.8h p.r.n.  4. Percocet 5/325 mg one q.6h p.r.n.  5. Ensure one can p.o. b.i.d.  6. Zithromax 1250 mg weekly.  7. Sodium chloride 1 g b.i.d. for 2 days.  8. Atripla one tablet daily.   PHYSICAL EXAMINATION:  GENERAL:  He is a middle-aged man not in acute  distress.  VITAL SIGNS:  The temperature is 98, the pulse is 63, respiratory rate  is 20, blood pressure 136/78.  HEENT:  Pupils are equal and reactive to light and accommodation,  extraocular muscles are intact.  Head is atraumatic, normocephalic.  NECK:  There is no neck stiffness.  CHEST:  Clinically clear.  CARDIAC:  Heart sounds 1 and 2, regular rate and rhythm.  ABDOMEN:  Scaphoid, soft and nontender, no  organomegaly.  NEURO:  He is alert and oriented x3.  EXTREMITIES:  Peripheral pulses are present and no pedal edema.   LABORATORY DATA:  The available labs show the stool culture negative.  Sodium is 134, potassium 4.2, chloride 102, bicarbonate 25, BUN 10,  creatinine 1.1, glucose is 78.  BSR is 22.  WBC is 3.6, hematocrit 39,  platelet count is 219.      Herbie Saxon, MD  Electronically Signed     MIO/MEDQ  D:  10/11/2007  T:  10/11/2007  Job:  [id number]

## 2011-02-25 NOTE — H&P (Signed)
NAMETREVOR, Eric Gardner                ACCOUNT NO.:  [id number]   MEDICAL RECORD NO.:  [id number]           PATIENT TYPE:   LOCATION:                                 FACILITY:   PHYSICIAN:  Madaline Savage, MD        DATE OF BIRTH:  July 11, 1961   DATE OF ADMISSION:  DATE OF DISCHARGE:                              HISTORY & PHYSICAL   PRIMARY CARE PHYSICIAN:  At [place], [place]. This patient is  unassigned to [place].   CHIEF COMPLAINT:  Shortness of breath and fever.   HISTORY OF PRESENT ILLNESS:  Eric Gardner is a 50 year old gentleman with  history of HIV for many years who comes in with progressively worsening  shortness of breath and fever for the last three weeks. His problems  started approximately three weeks ago when he started having shortness  of breath and mild cough. His breathing has gotten progressively  worsened. He started feeling chilly, and of late, he has been feverish.  He also complains of diarrhea which is chronic. He came to the ED on  Saturday, and at that time, he was evaluated and diagnosed with an upper  respiratory infection and was started on amoxicillin. He has not felt  any better since he was started on amoxicillin. His breathing is now  much worse, and he had a fever of 104 today.   PAST MEDICAL HISTORY:  1. HIV positive. He says he has been HIV positive for approximately 10      years. He states he has never been put on antiretroviral therapy.      He does not know his CD4 count or viral load. He follows up with a      doctor in [place]. He has had one episode of pneumonia in the      past.  2. Mitral valve prolapse.   PAST SURGICAL HISTORY:  None.   ALLERGIES:  None.   CURRENT MEDICATIONS:  Amoxicillin by mouth for the last 5 days.   SOCIAL HISTORY:  He lives with a roommate. He does not work. He smokes  approximately 1 pack of cigarettes a week, and he states he has been  smoking for the last 20 years. Denies any history of alcohol or  drug  abuse.   FAMILY HISTORY:  His father died in the 94s. He had diabetes, and he  died of heart failure. His mother passed away in her 58s. She also had  diabetes, and she passed away from kidney failure and its complications.   REVIEW OF SYSTEMS:  GENERAL:  He denies any recent weight loss or weight  gain. He does complain of fever and chills. He also states he has had  poor appetite for the last few days. HEENT:  Denies headaches, blurred  vision. He complains of sore throat. CARDIOVASCULAR:  Denies chest pain  or palpitations. RESPIRATORY SYSTEM:  He does have shortness of breath  and cough. GASTROINTESTINAL:  No abdominal pain, nausea, vomiting but  does have chronic diarrhea.   PHYSICAL EXAMINATION:  He is alert and oriented x3.  VITAL SIGNS:  Temperature is 104.8, pulse is 98 per minute, blood  pressure 144/87, respiratory rate of 13 per minute, oxygen saturation  94% on room air.  HEENT:  Head atraumatic and normocephalic. Pupils are equal, round, and  reactive to light. Mucous membranes are moist.  NECK:  Is supple. No JVD. No carotid bruits.  CARDIOVASCULAR SYSTEM:  S1 and S2 heard. Regular rhythm.  CHEST:  There was few crackles heard at bilateral basis with coarse  breath sounds.  ABDOMEN:  Soft, nontender. Bowel sounds heard.  EXTREMITIES:  No clubbing, cyanosis, or edema.   LABORATORY DATA:  Show a white count of 7.4, hemoglobin 11.7, platelets  380. Sodium 136, potassium 3.7, chloride 102, bicarbonate 27, BUN 12,  creatinine 1, glucose 97. His urinalysis is negative. He had an x-ray of  chest which showed a questionable right base opacity.   IMPRESSION:  1. Right lower lobe pneumonia in a HIV positive patient.  2. Human immunodeficiency virus positive.   PLAN:  This is a 50 year old gentleman who has a history of HIV positive  whose CD4 count and viral load are not known who comes in with what  appears like a right lower lobe pneumonia. His O2 saturations were  as  low as 91% on room air on admission, and one of the primary differential  diagnosis is Pneumocystis carinii pneumonia.  I will admit him to the  step-down bed at this time and watch his respiratory status closely. I  will get an ABG on room air, and I will start him empirically on  Bactrim, Rocephin and Zithromax. I will consult infectious disease  doctor to help with antibiotics. I will also get a CD4 count and a viral  load on him. I will also get PPD on him and keep him in isolation at  this time. I will put him on DVT prophylaxis.      Madaline Savage, MD  Electronically Signed     PKN/MEDQ  D:  09/16/2007  T:  09/16/2007  Job:  [id number]

## 2011-02-25 NOTE — Discharge Summary (Signed)
Eric Gardner, Eric Gardner                ACCOUNT NO.:  [id number]   MEDICAL RECORD NO.:  [id number]          PATIENT TYPE:  INP   LOCATION:  6713                         FACILITY:  [facility]   PHYSICIAN:  Lonia Blood, M.D.DATE OF BIRTH:  [birth year]   DATE OF ADMISSION:  09/16/2007  DATE OF DISCHARGE:  09/25/2007                               DISCHARGE SUMMARY   ADDENDUM   PRIMARY CARE PHYSICIAN:  Tresa Endo L. Philipp Deputy, M.D.   This is an addendum to a previously dictated discharge summary.  For  details concerning the patient's full acute hospitalization, please see  discharge summary [phone] per Dr. Roxan Hockey.   DISCHARGE DIAGNOSES:  1. Right lower lobe pneumonia, clinically consistent with Pneumocystis      carinii pneumonia.  2. Human immunodeficiency virus positive/acquired immunodeficiency      syndrome.  3. Oral candidiasis.  4. Deconditioning/weakness.  5. Anemia.  6. Hyperkalemia, resolved.   DISCHARGE MEDICATIONS:  1. Bactrim DS 2 tablets three times a day through October 06, 2007.  2. Azithromycin 60 mg 2 tablets each Wednesday.  3. Prednisone 10 mg 2 tablets daily through August 06, 2007.  4. Diflucan 100 mg one pill each week on Wednesday.   FOLLOW UP:  The patient is to followup in the [facility] with Dr. Philipp Deputy on September 29, 2007, at 10:45 a.m.   HOSPITAL COURSE:  For details concerning the patient's acute  hospitalization, please see previously dictated discharge summary.  The  patient's hospitalization was extended from September 23, 2007, through  September 25, 2007, while attempts were made to procure medications for  the patient and headway could be made in regard to his deconditioning.  By September 25, 2007, the ID clinic had provided the patient with enough  samples of his medication to last him through his followup clinic visit.  Additionally, his deconditioning had improved to the  point he was stable for discharge home.  On September 25, 2007, the  patient was afebrile with stable vital signs.  He had no resting  shortness of breath and his exercise tolerance was a reasonable.  He was  cleared for discharge home in an afebrile state tolerating a regular  diet.      Lonia Blood, M.D.  Electronically Signed     JTM/MEDQ  D:  09/25/2007  T:  09/27/2007  Job:  [id number]   cc:   Tresa Endo L. Philipp Deputy, M.D.

## 2011-07-18 LAB — BASIC METABOLIC PANEL
BUN: 11
CO2: 24
Chloride: 102
Chloride: 106
Creatinine, Ser: 1.11
Creatinine, Ser: 1.15
GFR calc Af Amer: >60
Glucose, Bld: 87
Potassium: 4.2
Sodium: 134 — ABNORMAL LOW

## 2011-07-18 LAB — I-STAT 8, (EC8 V) (CONVERTED LAB)
BUN: 34 — ABNORMAL HIGH
Bicarbonate: 27.1 — ABNORMAL HIGH
Chloride: 98
Glucose, Bld: 94
Hemoglobin: 18.7 — ABNORMAL HIGH
Operator id: 294521
pCO2, Ven: 52.3 — ABNORMAL HIGH
pH, Ven: 7.322 — ABNORMAL HIGH

## 2011-07-18 LAB — URINALYSIS, ROUTINE W REFLEX MICROSCOPIC
Bilirubin Urine: NEGATIVE
Nitrite: NEGATIVE
Specific Gravity, Urine: 1.025
Urobilinogen, UA: 0.2
pH: 6

## 2011-07-18 LAB — CBC
HCT: 39.5
HCT: 48.9
Hemoglobin: 13.3
Hemoglobin: 16.1
MCV: 81.3
Platelets: 246
RDW: 17.5 — ABNORMAL HIGH
WBC: 4.4

## 2011-07-18 LAB — COMPREHENSIVE METABOLIC PANEL
ALT: 36
Alkaline Phosphatase: 54
BUN: 21
CO2: 25
Calcium: 10.3
Chloride: 100
Chloride: 92 — ABNORMAL LOW
Creatinine, Ser: 1.24
Creatinine, Ser: 1.61 — ABNORMAL HIGH
GFR calc non Af Amer: 46 — ABNORMAL LOW
Glucose, Bld: 118 — ABNORMAL HIGH
Glucose, Bld: 97
Potassium: 4.6
Total Bilirubin: 0.8
Total Bilirubin: 1
Total Protein: 6.3

## 2011-07-18 LAB — CLOSTRIDIUM DIFFICILE EIA: C difficile Toxins A+B, EIA: NEGATIVE

## 2011-07-18 LAB — FECAL LACTOFERRIN, QUANT

## 2011-07-18 LAB — OSMOLALITY: Osmolality: 273 — ABNORMAL LOW

## 2011-07-18 LAB — POCT I-STAT CREATININE
Creatinine, Ser: 1.9 — ABNORMAL HIGH
Operator id: 294521

## 2011-07-18 LAB — DIFFERENTIAL
Basophils Absolute: 0
Eosinophils Relative: 1
Lymphocytes Relative: 14
Lymphs Abs: 0.6 — ABNORMAL LOW
Neutro Abs: 3.2

## 2011-07-18 LAB — URINE MICROSCOPIC-ADD ON

## 2011-07-18 LAB — GIARDIA/CRYPTOSPORIDIUM SCREEN(EIA): Giardia Screen - EIA: NEGATIVE

## 2011-07-18 LAB — SEDIMENTATION RATE: Sed Rate: 22 — ABNORMAL HIGH

## 2011-07-21 LAB — BASIC METABOLIC PANEL
BUN: 10
BUN: 12
BUN: 8
BUN: 9
CO2: 22
CO2: 23
CO2: 26
Calcium: 7.6 — ABNORMAL LOW
Calcium: 8 — ABNORMAL LOW
Calcium: 8.1 — ABNORMAL LOW
Calcium: 8.4
Calcium: 8.5
Chloride: 103
Chloride: 105
Chloride: 106
Chloride: 108
Creatinine, Ser: 0.76
Creatinine, Ser: 0.77
Creatinine, Ser: 0.81
Creatinine, Ser: 0.98
GFR calc Af Amer: >60
GFR calc Af Amer: >60
GFR calc Af Amer: >60
GFR calc non Af Amer: >60
GFR calc non Af Amer: >60
GFR calc non Af Amer: >60
GFR calc non Af Amer: >60
Glucose, Bld: 106 — ABNORMAL HIGH
Glucose, Bld: 116 — ABNORMAL HIGH
Glucose, Bld: 141 — ABNORMAL HIGH
Glucose, Bld: 76
Glucose, Bld: 92
Potassium: 3.8
Potassium: 3.8
Potassium: 4.1
Potassium: 5.2 — ABNORMAL HIGH
Sodium: 135
Sodium: 136

## 2011-07-21 LAB — DIFFERENTIAL
Basophils Absolute: 0
Basophils Absolute: 0
Basophils Absolute: 0
Basophils Absolute: 0
Basophils Absolute: 0
Basophils Absolute: 0
Basophils Absolute: 0.2 — ABNORMAL HIGH
Basophils Relative: 0
Basophils Relative: 0
Basophils Relative: 0
Basophils Relative: 1
Basophils Relative: 3 — ABNORMAL HIGH
Eosinophils Absolute: 0 — ABNORMAL LOW
Eosinophils Relative: 0
Eosinophils Relative: 0
Eosinophils Relative: 0
Eosinophils Relative: 1
Eosinophils Relative: 1
Eosinophils Relative: 2
Lymphocytes Relative: 10 — ABNORMAL LOW
Lymphocytes Relative: 5 — ABNORMAL LOW
Lymphocytes Relative: 7 — ABNORMAL LOW
Lymphocytes Relative: 8 — ABNORMAL LOW
Lymphocytes Relative: 8 — ABNORMAL LOW
Lymphs Abs: 0.4 — ABNORMAL LOW
Lymphs Abs: 0.6 — ABNORMAL LOW
Monocytes Absolute: 0.2
Monocytes Absolute: 0.4
Monocytes Absolute: 0.4
Monocytes Relative: 3
Monocytes Relative: 5
Monocytes Relative: 6
Neutro Abs: 3.8
Neutro Abs: 4.6
Neutro Abs: 5.9
Neutro Abs: 6
Neutro Abs: 6.2
Neutro Abs: 6.8
Neutrophils Relative %: 84 — ABNORMAL HIGH
Neutrophils Relative %: 87 — ABNORMAL HIGH
Neutrophils Relative %: 88 — ABNORMAL HIGH
Neutrophils Relative %: 89 — ABNORMAL HIGH

## 2011-07-21 LAB — BLOOD GAS, ARTERIAL
Drawn by: 270271
O2 Content: 3
O2 Saturation: 97.3
Patient temperature: 96.9
pH, Arterial: 7.482 — ABNORMAL HIGH

## 2011-07-21 LAB — CULTURE, BLOOD (ROUTINE X 2)
Culture: NO GROWTH
Culture: NO GROWTH

## 2011-07-21 LAB — I-STAT 8, (EC8 V) (CONVERTED LAB)
BUN: 12
Bicarbonate: 25.7 — ABNORMAL HIGH
Glucose, Bld: 97
pCO2, Ven: 36.8 — ABNORMAL LOW
pH, Ven: 7.452 — ABNORMAL HIGH

## 2011-07-21 LAB — CBC
HCT: 31.8 — ABNORMAL LOW
HCT: 32.4 — ABNORMAL LOW
HCT: 33.2 — ABNORMAL LOW
HCT: 34 — ABNORMAL LOW
HCT: 34.3 — ABNORMAL LOW
HCT: 34.4 — ABNORMAL LOW
Hemoglobin: 11.2 — ABNORMAL LOW
Hemoglobin: 11.3 — ABNORMAL LOW
Hemoglobin: 11.7 — ABNORMAL LOW
MCHC: 32.9
MCHC: 33
MCHC: 33.4
MCHC: 33.8
MCHC: 33.9
MCV: 80.4
MCV: 81
MCV: 81.5
Platelets: 358
Platelets: 516 — ABNORMAL HIGH
Platelets: 580 — ABNORMAL HIGH
Platelets: 603 — ABNORMAL HIGH
RBC: 3.78 — ABNORMAL LOW
RBC: 3.91 — ABNORMAL LOW
RBC: 3.98 — ABNORMAL LOW
RBC: 4.21 — ABNORMAL LOW
RDW: 14.6
RDW: 14.6
RDW: 14.9
RDW: 15.1
RDW: 15.3
RDW: 15.4
RDW: 15.7 — ABNORMAL HIGH
WBC: 6.6
WBC: 7.8
WBC: 8.4

## 2011-07-21 LAB — URINALYSIS, ROUTINE W REFLEX MICROSCOPIC
Glucose, UA: NEGATIVE
Protein, ur: 100 — AB

## 2011-07-21 LAB — LACTATE DEHYDROGENASE: LDH: 296 — ABNORMAL HIGH

## 2011-07-21 LAB — HIV-1 RNA QUANT-NO REFLEX-BLD
HIV 1 RNA Quant: 121000 copies/mL — ABNORMAL HIGH (ref <50)
HIV-1 RNA Quant, Log: 5.08 — ABNORMAL HIGH (ref <1.70)

## 2011-07-21 LAB — CARDIAC PANEL(CRET KIN+CKTOT+MB+TROPI)
CK, MB: 0.4
CK, MB: 0.4
CK, MB: 0.5
Relative Index: INVALID
Total CK: 59
Troponin I: 0.02
Troponin I: 0.03

## 2011-07-21 LAB — P CARINII SMEAR DFA: Pneumocystis carinii DFA: NEGATIVE

## 2011-07-21 LAB — PROTIME-INR: Prothrombin Time: 15.3 — ABNORMAL HIGH

## 2011-07-21 LAB — T-HELPER CELLS (CD4) COUNT (NOT AT ARMC): CD4 % Helper T Cell: 7 — ABNORMAL LOW

## 2011-07-21 LAB — URINE MICROSCOPIC-ADD ON

## 2011-07-21 LAB — TSH: TSH: 2.043

## 2011-07-22 LAB — DIFFERENTIAL
Eosinophils Relative: 1
Lymphocytes Relative: 16
Monocytes Absolute: 0.2
Monocytes Relative: 2 — ABNORMAL LOW
Neutro Abs: 5.9

## 2011-07-22 LAB — I-STAT 8, (EC8 V) (CONVERTED LAB)
BUN: 19
Chloride: 105
Glucose, Bld: 105 — ABNORMAL HIGH
Hemoglobin: 15.3
Potassium: 4
pCO2, Ven: 33.5 — ABNORMAL LOW
pH, Ven: 7.47 — ABNORMAL HIGH

## 2011-07-22 LAB — CBC
HCT: 40.4
Hemoglobin: 13.4
RBC: 5

## 2011-07-22 LAB — INFLUENZA A AND B ANTIGEN (CONVERTED LAB)
Inflenza A Ag: NEGATIVE
Influenza B Ag: NEGATIVE

## 2012-03-20 ENCOUNTER — Encounter (HOSPITAL_COMMUNITY): Payer: Self-pay | Admitting: *Deleted

## 2012-03-20 ENCOUNTER — Inpatient Hospital Stay (HOSPITAL_COMMUNITY)
Admission: EM | Admit: 2012-03-20 | Discharge: 2012-03-28 | DRG: 064 | Disposition: A | Discharge status: Skilled Nursing Facility | Payer: Medicare Other | Attending: Internal Medicine | Admitting: Internal Medicine

## 2012-03-20 ENCOUNTER — Emergency Department (HOSPITAL_COMMUNITY): Payer: Medicare Other

## 2012-03-20 DIAGNOSIS — F17200 Nicotine dependence, unspecified, uncomplicated: Secondary | ICD-10-CM | POA: Diagnosis present

## 2012-03-20 DIAGNOSIS — K5900 Constipation, unspecified: Secondary | ICD-10-CM | POA: Diagnosis present

## 2012-03-20 DIAGNOSIS — R29810 Facial weakness: Secondary | ICD-10-CM | POA: Diagnosis present

## 2012-03-20 DIAGNOSIS — I6350 Cerebral infarction due to unspecified occlusion or stenosis of unspecified cerebral artery: Principal | ICD-10-CM | POA: Diagnosis present

## 2012-03-20 DIAGNOSIS — R29898 Other symptoms and signs involving the musculoskeletal system: Secondary | ICD-10-CM | POA: Diagnosis present

## 2012-03-20 DIAGNOSIS — Z7902 Long term (current) use of antithrombotics/antiplatelets: Secondary | ICD-10-CM

## 2012-03-20 DIAGNOSIS — B20 Human immunodeficiency virus [HIV] disease: Secondary | ICD-10-CM | POA: Diagnosis present

## 2012-03-20 DIAGNOSIS — I10 Essential (primary) hypertension: Secondary | ICD-10-CM | POA: Diagnosis present

## 2012-03-20 DIAGNOSIS — I639 Cerebral infarction, unspecified: Secondary | ICD-10-CM

## 2012-03-20 DIAGNOSIS — Z79899 Other long term (current) drug therapy: Secondary | ICD-10-CM

## 2012-03-20 DIAGNOSIS — R471 Dysarthria and anarthria: Secondary | ICD-10-CM | POA: Diagnosis present

## 2012-03-20 DIAGNOSIS — F1410 Cocaine abuse, uncomplicated: Secondary | ICD-10-CM | POA: Diagnosis present

## 2012-03-20 DIAGNOSIS — I059 Rheumatic mitral valve disease, unspecified: Secondary | ICD-10-CM

## 2012-03-20 HISTORY — DX: Asymptomatic human immunodeficiency virus (hiv) infection status: Z21

## 2012-03-20 HISTORY — DX: Human immunodeficiency virus (HIV) disease: B20

## 2012-03-20 HISTORY — DX: Essential (primary) hypertension: I10

## 2012-03-20 LAB — COMPREHENSIVE METABOLIC PANEL
ALT: 20 U/L (ref 0–53)
AST: 20 U/L (ref 0–37)
Albumin: 4.4 g/dL (ref 3.5–5.2)
CO2: 24 mEq/L (ref 19–32)
Calcium: 9.6 mg/dL (ref 8.4–10.5)
GFR calc non Af Amer: 63 mL/min — ABNORMAL LOW (ref >90)
Sodium: 143 mEq/L (ref 135–145)
Total Protein: 7.7 g/dL (ref 6.0–8.3)

## 2012-03-20 LAB — DIFFERENTIAL
Basophils Absolute: 0 10*3/uL (ref 0.0–0.1)
Basophils Relative: 1 % (ref 0–1)
Eosinophils Relative: 10 % — ABNORMAL HIGH (ref 0–5)
Lymphocytes Relative: 52 % — ABNORMAL HIGH (ref 12–46)
Neutro Abs: 1.1 10*3/uL — ABNORMAL LOW (ref 1.7–7.7)

## 2012-03-20 LAB — POCT I-STAT, CHEM 8
BUN: 24 mg/dL — ABNORMAL HIGH (ref 6–23)
Calcium, Ion: 1.2 mmol/L (ref 1.12–1.32)
Chloride: 106 mEq/L (ref 96–112)
Creatinine, Ser: 1.3 mg/dL (ref 0.50–1.35)
Glucose, Bld: 61 mg/dL — ABNORMAL LOW (ref 70–99)
Potassium: 3.8 mEq/L (ref 3.5–5.1)

## 2012-03-20 LAB — CBC
MCHC: 35 g/dL (ref 30.0–36.0)
MCV: 86.9 fL (ref 78.0–100.0)
Platelets: 204 10*3/uL (ref 150–400)
RDW: 13.8 % (ref 11.5–15.5)
WBC: 3.9 10*3/uL — ABNORMAL LOW (ref 4.0–10.5)

## 2012-03-20 LAB — PROTIME-INR: Prothrombin Time: 12.7 seconds (ref 11.6–15.2)

## 2012-03-20 LAB — TROPONIN I: Troponin I: <0.3 ng/mL (ref <0.30)

## 2012-03-20 LAB — RAPID URINE DRUG SCREEN, HOSP PERFORMED
Benzodiazepines: NOT DETECTED
Cocaine: POSITIVE — AB
Opiates: NOT DETECTED

## 2012-03-20 LAB — APTT: aPTT: 28 seconds (ref 24–37)

## 2012-03-20 LAB — CK TOTAL AND CKMB (NOT AT ARMC)
CK, MB: 3.2 ng/mL (ref 0.3–4.0)
Relative Index: 1.2 (ref 0.0–2.5)

## 2012-03-20 MED ORDER — LORAZEPAM 2 MG/ML IJ SOLN
1.0000 mg | Freq: Once | INTRAMUSCULAR | Status: AC
  Administered 2012-03-20: 1 mg via INTRAVENOUS
  Filled 2012-03-20: qty 1

## 2012-03-20 NOTE — ED Notes (Signed)
Pt stated that he woke up from a nap yesterday around 2100. He stated he was having left arm numbness and weakness. He also was having right sided facial droop, difficulty talking,  and facial  burning. No issues with any right side extremities or left leg. Left sided deficits resolved themselves last night. Pt unaware of the time. R sided facial droop did not resolve and difficulty speaking did not resolve. He stated that earlier this afternoon he begin having weakness and numbness in his left arm again. Left side Deficit resolved prior to arrival. However, pt still having right side facial droop and difficulty speaking. Will continue to monitor.

## 2012-03-20 NOTE — ED Notes (Signed)
Urinal given to pt 

## 2012-03-20 NOTE — ED Provider Notes (Signed)
History     CSN: [id number]  Arrival date & time 03/20/12  1912   None     Chief Complaint  Patient presents with  . Extremity Weakness    (Consider location/radiation/quality/duration/timing/severity/associated sxs/prior treatment) HPI Comments: This 51 year old man says he woke from a nap yesterday evening and had weakness in the left arm, and a numb feeling on the side of his face, like after being at the dentist's. He did not seek evaluation at that time. He says he could walk but had difficulty using his left hand. His symptoms have persisted to today and he therefore seeks evaluation. He's had no prior similar symptoms. He has HIV, and takes Atripla.   Patient is a 51 y.o. male presenting with Acute Neurological Problem.  Cerebrovascular Accident This is a new (Onset of left sided weakness yesterday.) problem. The current episode started yesterday. The problem occurs constantly. The problem has not changed since onset.Pertinent negatives include no chest pain, no abdominal pain, no headaches and no shortness of breath. The symptoms are aggravated by nothing. The symptoms are relieved by nothing. He has tried nothing for the symptoms.    Past Medical History  Diagnosis Date  . Hypertension   . HIV (human immunodeficiency virus infection)     History reviewed. No pertinent past surgical history.  History reviewed. No pertinent family history.  History  Substance Use Topics  . Smoking status: Current Everyday Smoker  . Smokeless tobacco: Not on file  . Alcohol Use: No      Review of Systems  Constitutional: Negative for fever and chills.  HENT: Positive for trouble swallowing.   Eyes: Negative.   Respiratory: Negative.  Negative for shortness of breath.   Cardiovascular: Negative for chest pain.  Gastrointestinal: Negative for abdominal pain.  Genitourinary: Negative.   Musculoskeletal: Negative.   Skin: Negative.   Neurological: Positive for facial asymmetry,  weakness and numbness. Negative for headaches.  Psychiatric/Behavioral: Negative.     Allergies  Review of patient's allergies indicates no known allergies.  Home Medications   Current Outpatient Rx  Name Route Sig Dispense Refill  . ASPIRIN 325 MG PO TABS Oral Take 650 mg by mouth once as needed. As needed for current stroke like symptoms.    . EFAVIRENZ-EMTRICITAB-TENOFOVIR 600-200-300 MG PO TABS Oral Take 1 tablet by mouth at bedtime.      BP 143/92  Pulse 74  Temp(Src) 98.2 F (36.8 C) (Oral)  Resp 20  SpO2 98%  Physical Exam  Nursing note and vitals reviewed. Constitutional: He is oriented to person, place, and time.       Initial BP 140/92. Slender middle-aged man with visible left facial weakness.  HENT:  Head: Normocephalic and atraumatic.  Right Ear: External ear normal.  Left Ear: External ear normal.  Mouth/Throat: Oropharynx is clear and moist.  Eyes: Conjunctivae and EOM are normal. Pupils are equal, round, and reactive to light.  Neck: Normal range of motion. Neck supple.       No carotid bruit.  Cardiovascular: Normal rate, regular rhythm and normal heart sounds.   Pulmonary/Chest: Effort normal and breath sounds normal.  Abdominal: Soft. Bowel sounds are normal.  Musculoskeletal: He exhibits no edema.  Neurological: He is alert and oriented to person, place, and time.       He has left facial weakness, mildly slurred speech, and weakness in his left arm and leg.  Skin: Skin is warm and dry.  Psychiatric: He has a normal mood and affect.  His behavior is normal.    ED Course  Procedures (including critical care time) \8:53 PM  Date: 03/20/2012  Rate:57  Rhythm: normal sinus rhythm and sinus arrhythmia  QRS Axis: normal  Intervals: QT prolonged QRS:  Left ventricular hypertrophy; biatrial abnormalities;  Poor R wave progression suggests old anterior myocardial infarction.    ST/T Wave abnormalities: ST depressions inferiorly and ST depressions  laterally  Conduction Disutrbances:nonspecific intraventricular conduction delay  Narrative Interpretation: Abnormal EKG  Old EKG Reviewed: none available  9:15 PM Pt appears to have had a stroke, probably yesterday afternoon.  Lab workup ordered.  11:06 PM Results for orders placed during the hospital encounter of 03/20/12  PROTIME-INR      Component Value Range   Prothrombin Time 12.7  11.6 - 15.2 (seconds)   INR 0.93  0.00 - 1.49   APTT      Component Value Range   aPTT 28  24 - 37 (seconds)  CBC      Component Value Range   WBC 3.9 (*) 4.0 - 10.5 (K/uL)   RBC 5.03  4.22 - 5.81 (MIL/uL)   Hemoglobin 15.3  13.0 - 17.0 (g/dL)   HCT 40.9  81.1 - 91.4 (%)   MCV 86.9  78.0 - 100.0 (fL)   MCH 30.4  26.0 - 34.0 (pg)   MCHC 35.0  30.0 - 36.0 (g/dL)   RDW 78.2  95.6 - 21.3 (%)   Platelets 204  150 - 400 (K/uL)  DIFFERENTIAL      Component Value Range   Neutrophils Relative 27 (*) 43 - 77 (%)   Neutro Abs 1.1 (*) 1.7 - 7.7 (K/uL)   Lymphocytes Relative 52 (*) 12 - 46 (%)   Lymphs Abs 2.0  0.7 - 4.0 (K/uL)   Monocytes Relative 11  3 - 12 (%)   Monocytes Absolute 0.4  0.1 - 1.0 (K/uL)   Eosinophils Relative 10 (*) 0 - 5 (%)   Eosinophils Absolute 0.4  0.0 - 0.7 (K/uL)   Basophils Relative 1  0 - 1 (%)   Basophils Absolute 0.0  0.0 - 0.1 (K/uL)  COMPREHENSIVE METABOLIC PANEL      Component Value Range   Sodium 143  135 - 145 (mEq/L)   Potassium 3.9  3.5 - 5.1 (mEq/L)   Chloride 106  96 - 112 (mEq/L)   CO2 24  19 - 32 (mEq/L)   Glucose, Bld 65 (*) 70 - 99 (mg/dL)   BUN 22  6 - 23 (mg/dL)   Creatinine, Ser 0.86  0.50 - 1.35 (mg/dL)   Calcium 9.6  8.4 - 57.8 (mg/dL)   Total Protein 7.7  6.0 - 8.3 (g/dL)   Albumin 4.4  3.5 - 5.2 (g/dL)   AST 20  0 - 37 (U/L)   ALT 20  0 - 53 (U/L)   Alkaline Phosphatase 84  39 - 117 (U/L)   Total Bilirubin 0.3  0.3 - 1.2 (mg/dL)   GFR calc non Af Amer 63 (*) >90 (mL/min)   GFR calc Af Amer 73 (*) >90 (mL/min)  CK TOTAL AND CKMB       Component Value Range   Total CK 259 (*) 7 - 232 (U/L)   CK, MB 3.2  0.3 - 4.0 (ng/mL)   Relative Index 1.2  0.0 - 2.5   TROPONIN I      Component Value Range   Troponin I <0.30  <0.30 (ng/mL)  URINE RAPID DRUG SCREEN (HOSP PERFORMED)  Component Value Range   Opiates NONE DETECTED  NONE DETECTED    Cocaine POSITIVE (*) NONE DETECTED    Benzodiazepines NONE DETECTED  NONE DETECTED    Amphetamines NONE DETECTED  NONE DETECTED    Tetrahydrocannabinol NONE DETECTED  NONE DETECTED    Barbiturates NONE DETECTED  NONE DETECTED   GLUCOSE, CAPILLARY      Component Value Range   Glucose-Capillary 83  70 - 99 (mg/dL)  POCT I-STAT, CHEM 8      Component Value Range   Sodium 144  135 - 145 (mEq/L)   Potassium 3.8  3.5 - 5.1 (mEq/L)   Chloride 106  96 - 112 (mEq/L)   BUN 24 (*) 6 - 23 (mg/dL)   Creatinine, Ser 8.46  0.50 - 1.35 (mg/dL)   Glucose, Bld 61 (*) 70 - 99 (mg/dL)   Calcium, Ion 9.62  1.12 - 1.32 (mmol/L)   TCO2 27  0 - 100 (mmol/L)   Hemoglobin 17.0  13.0 - 17.0 (g/dL)   HCT 95.2  84.1 - 32.4 (%)   Ct Head Wo Contrast  03/20/2012  *RADIOLOGY REPORT*  Clinical Data: Left arm weakness  CT HEAD WITHOUT CONTRAST  Technique:  Contiguous axial images were obtained from the base of the skull through the vertex without contrast.  Comparison: 08/03/2009 MRI  Findings: Focus of hypoattenuation within the high right frontal lobe, corresponds to the location of a focus of restricted diffusion on the 2010 MRI.  There is no evidence for acute hemorrhage, overt hydrocephalus, mass lesion, or abnormal extra-axial fluid collection.  No definite CT evidence for acute cortical based (large artery) infarction. The The visualized paranasal sinuses and mastoid air cells are predominately clear.  No displaced calvarial fracture.  IMPRESSION: Focus of hypoattenuation within the high right frontal lobe, corresponds to the location of restricted diffusion on the 2010 MRI. Therefore, may represent sequelae of  prior infection or infarction.  However, if there is persistent concern for an acute intracranial process, MRI follow-up is recommend.  Original Report Authenticated By: Waneta Martins, M.D.   CT suggests CVA, in an area that was hypoattenuated on an MRI in 2010.  His physical findings are those of a completed stroke.  I advised that he needed admission to complete his stroke workup.  Will call the Outpatient Clinic to admit him, as he is a patient of Dr. Philipp Deputy.    11:10 PM Dr. Philipp Deputy has gone back to [facility], so is no longer covered by the teaching service.  Call to unassigned medicine to admit him.    11:40 PM Case discussed with Dr. Adela Glimpse, who will admit pt.  Team 6, neurotelemetry 3000, Dr. Ramiro Harvest attending.   1. CVA (cerebral infarction)       Carleene Cooper III, MD 03/21/12 1320

## 2012-03-20 NOTE — ED Notes (Signed)
Pt states that last night around 20:00 or 21:00 he started having numbness to his left arm and left side of face. Pt states that he was unable to move left arm last night. Pt went to sleep and woke up this morning was unable to chew his food, due to numbness in left side of face. Pt states he is also having slowed slurred speech. Pt waiting until 19:00 tonight to come n because he was feeling a little better through out the day and thought it would go away. Pt does have left sided facial droop, and left arm weakness with griping. Pt denies affects to legs.

## 2012-03-20 NOTE — ED Notes (Signed)
Dr. Ignacia Palma and pt/family made aware that pt cannot have anything to eat or drink d/t risk of aspiration (pt did not pass swallow eval).

## 2012-03-20 NOTE — ED Notes (Signed)
Blood given to Phlebotomy

## 2012-03-21 ENCOUNTER — Inpatient Hospital Stay (HOSPITAL_COMMUNITY): Payer: Medicare Other

## 2012-03-21 ENCOUNTER — Encounter (HOSPITAL_COMMUNITY): Payer: Self-pay | Admitting: Internal Medicine

## 2012-03-21 DIAGNOSIS — G459 Transient cerebral ischemic attack, unspecified: Secondary | ICD-10-CM

## 2012-03-21 DIAGNOSIS — I6340 Cerebral infarction due to embolism of unspecified cerebral artery: Secondary | ICD-10-CM

## 2012-03-21 DIAGNOSIS — G8190 Hemiplegia, unspecified affecting unspecified side: Secondary | ICD-10-CM

## 2012-03-21 DIAGNOSIS — F1910 Other psychoactive substance abuse, uncomplicated: Secondary | ICD-10-CM

## 2012-03-21 DIAGNOSIS — I639 Cerebral infarction, unspecified: Secondary | ICD-10-CM | POA: Diagnosis present

## 2012-03-21 DIAGNOSIS — B20 Human immunodeficiency virus [HIV] disease: Secondary | ICD-10-CM

## 2012-03-21 LAB — CARDIAC PANEL(CRET KIN+CKTOT+MB+TROPI)
CK, MB: 2.1 ng/mL (ref 0.3–4.0)
Relative Index: 1.4 (ref 0.0–2.5)
Troponin I: <0.3 ng/mL (ref <0.30)
Troponin I: <0.3 ng/mL (ref <0.30)

## 2012-03-21 MED ORDER — ACETAMINOPHEN 325 MG PO TABS: 650.0000 mg | ORAL_TABLET | ORAL | Status: DC | PRN

## 2012-03-21 MED ORDER — ACETAMINOPHEN 650 MG RE SUPP: 650.0000 mg | RECTAL | Status: DC | PRN

## 2012-03-21 MED ORDER — ONDANSETRON HCL 4 MG/2ML IJ SOLN: 4.0000 mg | Freq: Four times a day (QID) | INTRAMUSCULAR | Status: DC | PRN

## 2012-03-21 MED ORDER — SODIUM CHLORIDE 0.9 % IV SOLN
INTRAVENOUS | Status: DC
  Administered 2012-03-21: 17:00:00 via INTRAVENOUS

## 2012-03-21 MED ORDER — ASPIRIN 300 MG RE SUPP
300.0000 mg | Freq: Every day | RECTAL | Status: DC
  Administered 2012-03-21: 300 mg via RECTAL
  Filled 2012-03-21 (×2): qty 1

## 2012-03-21 MED ORDER — SODIUM CHLORIDE 0.9 % IV SOLN
INTRAVENOUS | Status: DC
  Administered 2012-03-21: 04:00:00 via INTRAVENOUS

## 2012-03-21 MED ORDER — EFAVIRENZ-EMTRICITAB-TENOFOVIR 600-200-300 MG PO TABS
1.0000 | ORAL_TABLET | Freq: Every day | ORAL | Status: DC
  Administered 2012-03-21 – 2012-03-27 (×7): 1 via ORAL
  Filled 2012-03-21 (×9): qty 1

## 2012-03-21 MED ORDER — SODIUM CHLORIDE 0.9 % IV SOLN
INTRAVENOUS | Status: DC
  Administered 2012-03-21 – 2012-03-22 (×4): via INTRAVENOUS

## 2012-03-21 MED ORDER — SENNOSIDES-DOCUSATE SODIUM 8.6-50 MG PO TABS: 1.0000 | ORAL_TABLET | Freq: Every evening | ORAL | Status: DC | PRN

## 2012-03-21 MED ORDER — ASPIRIN 325 MG PO TABS
325.0000 mg | ORAL_TABLET | Freq: Every day | ORAL | Status: DC
  Administered 2012-03-22: 325 mg via ORAL
  Filled 2012-03-21: qty 1

## 2012-03-21 NOTE — Consult Note (Signed)
Referring Physician:Janee Morn    Chief Complaint: CVA  HPI: Eric Gardner is an 51 y.o. male developed LUE and left facial numbness around 20:00 03/19/12.  Woke in am unable to move left arm, unable to chew due to numbness, and slurred speech.  Delayed ER evaluation because he thought he'd get better.  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  Risk factors include tobacco use, cocaine use, HTN and HIV.  Neurology consult requested.  Subjective: Today, patient states that is speech is better and he has more ability to move the left UE.  Altered sensation persists.   LSN:[id number] 03/19/12  tPA Given: No: Outside window  Past Medical History  Diagnosis Date  . Hypertension   . HIV (human immunodeficiency virus infection)    History reviewed. No pertinent past surgical history.  History reviewed. No pertinent family history.  Social History:  Smokes.  He does not have any smokeless tobacco history on file. He reports that he does not drink alcohol or use illicit drugs.  Allergies: No Known Allergies  Medications: Scheduled:   . aspirin  300 mg Rectal Daily   Or  . aspirin  325 mg Oral Daily  . efavirenz-emtrictabine-tenofovir  1 tablet Oral QHS  . LORazepam  1 mg Intravenous Once   ROS: No fever, chills, n/v/d/c.  No headache or dizziness.  Mild blurred vision improving, no diploplia.  Denies chest pain, shortness of breath or palpitations.  See HPI.  Physical Examination: Blood pressure 115/80, pulse 62, temperature 97.5 F (36.4 C), temperature source Oral, resp. rate 18, height 5\' 9"  (1.753 m), weight 51.483 kg (113 lb 8 oz), SpO2 100.00%. Telemetry: SR  Neurologic Examination: Mental Status: Alert, oriented, thought content appropriate.  Speech fluent without evidence of aphasia or dysarthria.  Able to follow 3 step commands without difficulty. Cranial Nerves: II- diminished temporal visual field OD. III/IV/VI-Extraocular movements intact.   Pupils reactive bilaterally. V/VII-left lower facial droop VIII-grossly intact XI-slightly weaker shrug on left. XII-midline tongue extension Motor: 5/5 RUE, RLE.  5-/5 LUE, LLE at hip flexor. normal tone. Generalized decreased muscle bulk throughout. + drift LUE. Sensory: light touch intact diminished left hemi body Deep Tendon Reflexes: 1-2+ UE, 3+ patellars, 2+ AJs bilaterally. Plantars: downgoing right, upgoing left Cerebellar: Normal finger-to-nose, normal rapid alternating movements and normal heel-to-shin test.  Normal gait and station.  Basic Metabolic Panel:  Lab 03/20/12 7829 03/20/12 2107  NA 144 143  K 3.8 3.9  CL 106 106  CO2 -- 24  GLUCOSE 61* 65*  BUN 24* 22  CREATININE 1.30 1.29  CALCIUM -- 9.6  MG -- --  PHOS -- --   Liver Function Tests:  Lab 03/20/12 2107  AST 20  ALT 20  ALKPHOS 84  BILITOT 0.3  PROT 7.7  ALBUMIN 4.4   CBC:  Lab 03/20/12 2206 03/20/12 2107  WBC -- 3.9*  NEUTROABS -- 1.1*  HGB 17.0 15.3  HCT 50.0 43.7  MCV -- 86.9  PLT -- 204   Cardiac Enzymes:  Lab 03/21/12 0829 03/20/12 2107  CKTOTAL 160 259*  CKMB 2.3 3.2  CKMBINDEX -- --  TROPONINI <0.30 <0.30   CBG:  Lab 03/20/12 2126  GLUCAP 83   Coagulation:  Lab 03/20/12 2107  LABPROT 12.7  INR 0.93   Urine Drug Screen: Drugs of Abuse     Component Value Date/Time   LABOPIA NONE DETECTED 03/20/2012 2133   COCAINSCRNUR POSITIVE* 03/20/2012 2133   LABBENZ NONE  DETECTED 03/20/2012 2133   AMPHETMU NONE DETECTED 03/20/2012 2133   THCU NONE DETECTED 03/20/2012 2133   LABBARB NONE DETECTED 03/20/2012 2133    IMAGING:  03/20/2012 CT HEAD WITHOUT CONTRAST  Findings: Focus of hypoattenuation within the high right frontal lobe, corresponds to the location of a focus of restricted diffusion on the 2010 MRI.  There is no evidence for acute hemorrhage, overt hydrocephalus, mass lesion, or abnormal extra-axial fluid collection.  No definite CT evidence for acute cortical based (large artery)  infarction. The The visualized paranasal sinuses and mastoid air cells are predominately clear.  No displaced calvarial fracture.  IMPRESSION: Focus of hypoattenuation within the high right frontal lobe, corresponds to the location of restricted diffusion on the 2010 MRI.  Waneta Martins, M.D.    03/21/2012 MRI HEAD WITHOUT CONTRAST MRA HEAD WITHOUT CONTRAST   Findings:  There is an acute 11 x 26 mm region of acute infarction affecting the right posterior frontal cortex near the central sulcus.  There is no hemorrhage, mass lesion, hydrocephalus, or extra-axial fluid.  There is slight premature atrophy. Scattered foci of prolonged T2 signal and the subcortical and periventricular white matter signal are nonspecific, possibly representing chronic microvascular ischemic change due hypertension, chronic infection, or idiopathic. No midline shift.  Major intracranial vascular structures are patent.  Skull base and calvarium negative.  Unremarkable pituitary and cerebellar tonsils.  Contrast was not administered.  Compared with 2010, this infarct is slightly more posterior and correlates with the observed cytotoxic edema demonstrated on yesterday's CT.  IMPRESSION: Findings consistent with acute right posterior frontal cortical infarction.  No hemorrhage. Elsie Stain, M.D.     03/21/2012 MRA HEAD WITHOUT CONTRAST  Findings: There are no focal lesions of the skull base, cavernous, or supraclinoid internal carotid arteries.  No visible evidence for dissection or pseudoaneurysm.  The basilar arteries widely patent.  Vertebrals are codominant.  There is no proximal stenosis of the anterior, middle, or posterior cerebral arteries.  No cerebellar branch occlusion is observed.  On submentovertex projections of the anterior circulation, there is a suggestion of diminished caliber or possibly absence of a right M3 or M4 MCA vessel subserving the area of acute infarction.  It is unclear if this represents spasm or  occlusion.  Formal catheter angiogram could be helpful in further evaluation.  IMPRESSION: No proximal stenosis, dissection or occlusion. Suspect diminished or absent flow related enhancement right posterior frontal MCA branch. Elsie Stain, M.D.    Assessment: 51 y.o. male with persistent left sided weakness, paresthesia and dysarthria.   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  Discussed importance of IDU and tobacco cessation with respect to recurrent stoke and other health risks.  Patient verbalizes understanding.   Stroke Risk factors include tobacco use, cocaine use, HTN and HIV.    Plan: 1. HgbA1c, fasting lipid panel 2.  MRA of the brain without contrast 3. PT consult, OT consult, Speech consult 4. Echocardiogram 5. Carotid dopplers 6. Prophylactic therapy- ASA 325 mg 7. Risk factor modification 8. Polysubstance abuse counseling/ Smoking cessation consult.  Stroke team will follow with you.  Thank you for consult.  Marya Fossa PA-C [facility] [phone] 03/21/2012, 1:30 PM  Patient seen and examined. I agree with the above.  Thana Farr, MD [facility] [phone]  03/21/2012  3:48 PM

## 2012-03-21 NOTE — H&P (Signed)
PCP:  Bo Merino, MD, MD    Chief Complaint:   Hard time walking   HPI: Eric Gardner is a 51 y.o. male   has a past medical history of Hypertension and HIV (human immunodeficiency virus infection).   Presented with facial droop and left-sided weakness unknown periods time. Seems like he woke up last night with those symptoms but did not seek medical care until today. Patient U. tox is positive for cocaine. He has been agitated in emergency department and was given IV Ativan for sedation. At this point I am unable to obtain good history secondary to patient being sedated. Brother-in-law at bedside but does not know what happened to the patient.  Review of Systems:  Unable to obtain Past Medical History: Past Medical History  Diagnosis Date  . Hypertension   . HIV (human immunodeficiency virus infection)    History reviewed. No pertinent past surgical history.   Medications: Prior to Admission medications   Medication Sig Start Date End Date Taking? Authorizing Provider  aspirin 325 MG tablet Take 650 mg by mouth once as needed. As needed for current stroke like symptoms.   Yes Historical Provider, MD  efavirenz-emtrictabine-tenofovir (ATRIPLA) 600-200-300 MG per tablet Take 1 tablet by mouth at bedtime.   Yes Historical Provider, MD    Allergies:  No Known Allergies  Social History:  Ambulatory  independently     reports that he has been smoking.  He does not have any smokeless tobacco history on file. He reports that he does not drink alcohol or use illicit drugs.   Family History: family history is not on file.    Physical Exam: Patient Vitals for the past 24 hrs:  BP Temp Temp src Pulse Resp SpO2  03/21/12 0100 122/82 mmHg - - 71  13  99 %  03/21/12 0045 131/100 mmHg - - 73  14  98 %  03/21/12 0030 123/79 mmHg - - 81  16  98 %  03/21/12 0015 125/85 mmHg - - 80  15  99 %  03/21/12 0000 119/82 mmHg - - 73  13  99 %  03/20/12 2345 123/85 mmHg -  - 71  14  99 %  03/20/12 2330 138/86 mmHg - - 71  18  97 %  03/20/12 2315 138/99 mmHg - - 44  16  78 %  03/20/12 2300 125/97 mmHg 98.2 F (36.8 C) - 54  11  99 %  03/20/12 2245 142/85 mmHg - - 53  12  98 %  03/20/12 2230 135/89 mmHg - - 56  14  98 %  03/20/12 2215 136/94 mmHg - - 58  14  99 %  03/20/12 2115 156/107 mmHg - - 59  14  100 %  03/20/12 2100 131/92 mmHg - - 56  15  100 %  03/20/12 2045 132/103 mmHg - - 51  18  100 %  03/20/12 1916 143/92 mmHg 98.2 F (36.8 C) Oral 74  20  98 %    1. General:  in No Acute distress 2. Psychological: Alert and is Oriented 3. Head/ENT:   Moist is good pulses in the wrist and 1 in the Membranes                          Head Non traumatic, neck supple  Normal  Dentition 4. SKIN: normal  Skin turgor,  Skin clean Dry and intact no rash 5. Heart: Regular rate and rhythm no Murmur, Rub or gallop 6. Lungs: Clear to auscultation bilaterally, no wheezes or crackles   7. Abdomen: Soft, non-tender, Non distended 8. Lower extremities: no clubbing, cyanosis, or edema 9. Neurologically  pronounced left facial droop, weakness of left upper and lower extremity 3/5 10. MSK: Normal range of motion  body mass index is unknown because there is no height or weight on file.   Labs on Admission:   [facility] 03/20/12 2206 03/20/12 2107  NA 144 143  K 3.8 3.9  CL 106 106  CO2 -- 24  GLUCOSE 61* 65*  BUN 24* 22  CREATININE 1.30 1.29  CALCIUM -- 9.6  MG -- --  PHOS -- --    Basename 03/20/12 2107  AST 20  ALT 20  ALKPHOS 84  BILITOT 0.3  PROT 7.7  ALBUMIN 4.4   No results found for this basename: LIPASE:2,AMYLASE:2 in the last 72 hours  Basename 03/20/12 2206 03/20/12 2107  WBC -- 3.9*  NEUTROABS -- 1.1*  HGB 17.0 15.3  HCT 50.0 43.7  MCV -- 86.9  PLT -- 204    Basename 03/20/12 2107  CKTOTAL 259*  CKMB 3.2  CKMBINDEX --  TROPONINI <0.30   No results found for this basename: TSH,T4TOTAL,FREET3,T3FREE,THYROIDAB  in the last 72 hours No results found for this basename: VITAMINB12:2,FOLATE:2,FERRITIN:2,TIBC:2,IRON:2,RETICCTPCT:2 in the last 72 hours No results found for this basename: HGBA1C    The CrCl is unknown because both a height and weight (above a minimum accepted value) are required for this calculation. ABG    Component Value Date/Time   PHART 7.482* 09/16/2007 2002   HCO3 27.1* 10/06/2007 1744   TCO2 27 03/20/2012 2206   ACIDBASEDEF 0.8 09/16/2007 2002   O2SAT 97.3 09/16/2007 2002     No results found for this basename: DDIMER     Other results:  I have pearsonaly reviewed this: ECG REPORT  Rate:57  Rhythm: sinus  rhythm  ST&T Change:  early repolarization and evidence of hypertrophy no significant change from prior   Drug screen positive for cocaine    Cultures:    Component Value Date/Time   SDES CSF 07/31/2009 1716   SPECREQUEST 6CC 07/31/2009 1716   CULT NO GROWTH 3 DAYS 07/31/2009 1716   REPTSTATUS 08/04/2009 FINAL 07/31/2009 1716       Radiological Exams on Admission: Ct Head Wo Contrast  03/20/2012  *RADIOLOGY REPORT*  Clinical Data: Left arm weakness  CT HEAD WITHOUT CONTRAST  Technique:  Contiguous axial images were obtained from the base of the skull through the vertex without contrast.  Comparison: 08/03/2009 MRI  Findings: Focus of hypoattenuation within the high right frontal lobe, corresponds to the location of a focus of restricted diffusion on the 2010 MRI.  There is no evidence for acute hemorrhage, overt hydrocephalus, mass lesion, or abnormal extra-axial fluid collection.  No definite CT evidence for acute cortical based (large artery) infarction. The The visualized paranasal sinuses and mastoid air cells are predominately clear.  No displaced calvarial fracture.  IMPRESSION: Focus of hypoattenuation within the high right frontal lobe, corresponds to the location of restricted diffusion on the 2010 MRI. Therefore, may represent sequelae of prior infection or  infarction.  However, if there is persistent concern for an acute intracranial process, MRI follow-up is recommend.  Original Report Authenticated By: Waneta Martins, M.D.    Assessment/Plan  51 year-old gentleman with history of HIV unknown CD4 count presents with strokelike symptoms her CT scan worrisome for seat for stroke in exposure to cocaine   Present on Admission:  .CVA (cerebral infarction) -  - will admit based on TIA/CVA protocol, await results of MRA/MRI, Carotid Doppler and Echo, obtain cardiac enzymes,  ECG, Homocysteine, Lipid panel, TSH. Order PT/OT evaluation. Will make sure patient is on antiplatelet agent.  Neurology consult. In a.m.     Marland KitchenHIV DISEASE - obtain CD4 count continue his antivirals. We'll need to make sure he he is plugged in to the ID clinic   Prophylaxis: SCD , Protonix   Other plan as per orders.  I have spent a total of  on this admission  Granger Chui 03/21/2012, 12:43 AM

## 2012-03-21 NOTE — Evaluation (Signed)
Physical Therapy Evaluation Patient Details Name: Eric Gardner MRN: [id number] DOB: 05/09/1961 Today's Date: 03/21/2012 Time: 1541-1600 PT Time Calculation (min): 19 min  PT Assessment / Plan / Recommendation Clinical Impression  Pt presents with a medical diagnosis of right posterior frontal infarct. Pt with left sided weakness, decreased sensation, coordination and decreased balance. Pt will make an excellent rehab candidate for further therapy prior to d/c. Pt will benefit from skilled PT in the acute care setting in order to maximize functional mobility, strength and safety.    PT Assessment  Patient needs continued PT services    Follow Up Recommendations  Inpatient Rehab    Barriers to Discharge Decreased caregiver support (stated potential family assist here in [employer]) pt lives in [place] with a roomate, family lives here in [place]    lEquipment Recommendations  Defer to next venue    Recommendations for Other Services Rehab consult   Frequency Min 4X/week    Precautions / Restrictions Precautions Precautions: Fall Restrictions Weight Bearing Restrictions: No         Mobility  Bed Mobility Bed Mobility: Supine to Sit;Sitting - Scoot to Edge of Bed Supine to Sit: 4: Min guard Sitting - Scoot to [place] of Bed: 4: Min guard Sit to Supine: 4: Min guard Details for Bed Mobility Assistance: VC for proper sequencing.  Increased time to complete transfer Transfers Transfers: Sit to Stand;Stand to Sit Sit to Stand: 4: Min assist;With upper extremity assist;From bed Stand to Sit: 4: Min assist;With upper extremity assist;To bed Details for Transfer Assistance: VC for hand placement. Min assist for stability and controlled descent Ambulation/Gait Ambulation/Gait Assistance: 4: Min assist;3: Mod assist Ambulation Distance (Feet): 120 Feet Assistive device: 1 person hand held assist Ambulation/Gait Assistance Details: Min assist for stability, requiring mod assist  at times due to LOB. Gait Pattern: Scissoring;Narrow base of support;Decreased step length - right;Decreased stance time - left;Decreased hip/knee flexion - left Gait velocity: decreased gait speed Modified Rankin (Stroke Patients Only) Pre-Morbid Rankin Score: No symptoms Modified Rankin: Moderately severe disability    Exercises     PT Diagnosis: Difficulty walking;Hemiplegia non-dominant side;Generalized weakness  PT Problem List: Decreased strength;Decreased activity tolerance;Decreased balance;Decreased coordination;Decreased mobility;Decreased knowledge of use of DME;Decreased safety awareness;Decreased knowledge of precautions;Impaired sensation PT Treatment Interventions: DME instruction;Gait training;Stair training;Functional mobility training;Therapeutic activities;Balance training;Therapeutic exercise;Neuromuscular re-education;Patient/family education   PT Goals Acute Rehab PT Goals PT Goal Formulation: With patient Time For Goal Achievement: 03/28/12 Potential to Achieve Goals: Good Pt will go Supine/Side to Sit: with modified independence PT Goal: Supine/Side to Sit - Progress: Goal set today Pt will go Sit to Supine/Side: with modified independence PT Goal: Sit to Supine/Side - Progress: Goal set today Pt will go Sit to Stand: with supervision PT Goal: Sit to Stand - Progress: Goal set today Pt will go Stand to Sit: with supervision PT Goal: Stand to Sit - Progress: Goal set today Pt will Ambulate: >150 feet;with supervision;with least restrictive assistive device PT Goal: Ambulate - Progress: Goal set today  Visit Information  Last PT Received On: 03/21/12 Assistance Needed: +1    Subjective Data      Prior Functioning  Home Living Lives With: Other (Comment) (roomate) Available Help at Discharge: Other (Comment) (none) Type of Home: House Home Access: Stairs to enter [employer] of Steps: 4 Entrance Stairs-Rails: Left Home Layout: Two  level Alternate Level Stairs-Number of Steps: 13 Alternate Level Stairs-Rails: Right Bathroom Shower/Tub: Tub/shower unit;Curtain Bathroom Toilet: Standard Bathroom Accessibility: Yes How Accessible:  Accessible via walker Home Adaptive Equipment: None Prior Function Level of Independence: Independent Able to Take Stairs?: Yes Driving: Yes Vocation: On disability Communication Communication: Expressive difficulties Dominant Hand: Right    Cognition  Overall Cognitive Status: Appears within functional limits for tasks assessed/performed Arousal/Alertness: Awake/alert Orientation Level: Appears intact for tasks assessed Behavior During Session: [facility] for tasks performed    Extremity/Trunk Assessment Right Lower Extremity Assessment RLE ROM/Strength/Tone: Within functional levels RLE Sensation: WFL - Light Touch RLE Coordination: WFL - gross/fine motor Left Lower Extremity Assessment LLE ROM/Strength/Tone: Deficits LLE ROM/Strength/Tone Deficits: MMT 4/5 Generally. LLE Sensation: Deficits LLE Sensation Deficits: decreased sensation entire LLE LLE Coordination: Deficits LLE Coordination Deficits: discoordinated heel/shin test.   Balance Balance Balance Assessed: Yes (mod assist at times during gait)  End of Session PT - End of Session Equipment Utilized During Treatment: Gait belt Activity Tolerance: Patient tolerated treatment well Patient left: in bed;with call bell/phone within reach;with bed alarm set Nurse Communication: Mobility status   Milana Kidney 03/21/2012, 4:58 PM  03/21/2012 Milana Kidney DPT PAGER: [phone] OFFICE: [phone]

## 2012-03-21 NOTE — Progress Notes (Signed)
SLP Cancellation Note  Treatment cancelled today due to patient receiving procedure or test. Pt currently off the floor for MRI.  Will continue efforts. RN aware. Annabelle Rexroad B. Reid Hope King, [facility], CCC-SLP [phone]  Leigh Aurora 03/21/2012, 11:10 AM

## 2012-03-21 NOTE — Plan of Care (Signed)
Problem: Phase II Progression Outcomes Goal: Tolerating diet Outcome: Progressing Tino Ronan B. Doralee Kocak, MSP, CCC-SLP [phone]        

## 2012-03-21 NOTE — ED Notes (Signed)
Dr. Doutova at bedside.  

## 2012-03-21 NOTE — Evaluation (Signed)
Clinical/Bedside Swallow Evaluation Patient Details  Name: Eric Gardner MRN: [id number] Date of Birth: [birth year]  Today's Date: 03/21/2012 Time: 1230-1245 SLP Time Calculation (min): 15 min  Past Medical History:  Past Medical History  Diagnosis Date  . Hypertension   . HIV (human immunodeficiency virus infection)    Past Surgical History: History reviewed. No pertinent past surgical history. HPI:  51 year old male admitted 03/20/12 with facial droop and left sided weakness. PMH significant for HTN, HIV, cocaine and marijuana use, tobacco use   Assessment / Plan / Recommendation Clinical Impression  No overt s/s aspiration observed. Pt reported difficulty with biting the left inside cheek yesterday, due to numbness which continues today.  Left side labial and lingual weakness and numbness observed and/or reported.  No oral leakage with po trials.  Pt is right handed.  Will begin dys 3 diet with chopped meats, due to LUE weakness.  Thin liquids ok    Aspiration Risk  Mild    Diet Recommendation Dysphagia 3 (Mechanical Soft);Thin liquid (chop meats due to LUE weakness)   Liquid Administration via: Cup;Straw Medication Administration: Whole meds with liquid Supervision: Patient able to self feed Compensations: Slow rate;Small sips/bites;Check for pocketing Postural Changes and/or Swallow Maneuvers: Seated upright 90 degrees    Other  Recommendations Oral Care Recommendations: Oral care BID Other Recommendations: Clarify dietary restrictions   Follow Up Recommendations  Inpatient Rehab    Frequency and Duration min 1 x/week  1 week   Pertinent Vitals/Pain No pain reported    SLP Swallow Goals Patient will consume recommended diet without observed clinical signs of aspiration with: Set-up Swallow Study Goal #1 - Progress: Progressing toward goal Patient will utilize recommended strategies during swallow to increase swallowing safety with: Set-up Swallow Study Goal #2 -  Progress: Progressing toward goal   Swallow Study Prior Functional Status   Independent    General Date of Onset: 03/20/12 HPI: 51 year old male admitted 03/20/12 with facial droop and left sided weakness. PMH significant for HTN, HIV, cocaine and marijuana use, tobacco use Type of Study: Bedside swallow evaluation Diet Prior to this Study: NPO Respiratory Status: Room air History of Recent Intubation: No Behavior/Cognition: Alert;Cooperative;Pleasant mood Oral Cavity - Dentition: Dentures, top;Missing dentition Self-Feeding Abilities: Able to feed self Patient Positioning: Upright in bed Baseline Vocal Quality: Clear Volitional Cough: Strong Volitional Swallow: Able to elicit    Oral/Motor/Sensory Function Overall Oral Motor/Sensory Function: Impaired Labial ROM: Reduced left Labial Symmetry: Abnormal symmetry left Labial Strength: Within Functional Limits Labial Sensation: Reduced Lingual ROM: Within Functional Limits Lingual Symmetry: Within Functional Limits Lingual Strength: Reduced Lingual Sensation: Within Functional Limits Facial ROM: Reduced left Facial Symmetry: Left droop Facial Strength: Reduced Facial Sensation: Reduced Velum: Within Functional Limits Mandible: Within Functional Limits   Ice Chips Ice chips: Not tested   Thin Liquid Thin Liquid: Within functional limits Presentation: Cup;Self Fed;Straw    Nectar Thick Nectar Thick Liquid: Not tested   Honey Thick Honey Thick Liquid: Not tested   Puree Puree: Within functional limits Presentation: Self Fed;Spoon   Solid Solid: Within functional limits Presentation: Self Fed   Darielle Hancher B. Royal Kunia, MSP, CCC-SLP [phone]  Leigh Aurora 03/21/2012,12:53 PM

## 2012-03-21 NOTE — Progress Notes (Signed)
I have seen and assessed patient and agree with Dr Celene Kras assessment and plan. Neuro consult pending.

## 2012-03-22 DIAGNOSIS — G8190 Hemiplegia, unspecified affecting unspecified side: Secondary | ICD-10-CM

## 2012-03-22 DIAGNOSIS — F1910 Other psychoactive substance abuse, uncomplicated: Secondary | ICD-10-CM

## 2012-03-22 DIAGNOSIS — G459 Transient cerebral ischemic attack, unspecified: Secondary | ICD-10-CM

## 2012-03-22 DIAGNOSIS — B20 Human immunodeficiency virus [HIV] disease: Secondary | ICD-10-CM

## 2012-03-22 DIAGNOSIS — G8110 Spastic hemiplegia affecting unspecified side: Secondary | ICD-10-CM

## 2012-03-22 DIAGNOSIS — I6340 Cerebral infarction due to embolism of unspecified cerebral artery: Secondary | ICD-10-CM

## 2012-03-22 LAB — CARDIAC PANEL(CRET KIN+CKTOT+MB+TROPI)
CK, MB: 1.7 ng/mL (ref 0.3–4.0)
CK, MB: 1.7 ng/mL (ref 0.3–4.0)
CK, MB: 2 ng/mL (ref 0.3–4.0)
Relative Index: 1.4 (ref 0.0–2.5)
Relative Index: 1.1 (ref 0.0–2.5)
Total CK: 121 U/L (ref 7–232)
Troponin I: <0.3 ng/mL (ref <0.30)
Troponin I: <0.3 ng/mL (ref <0.30)

## 2012-03-22 LAB — CBC
HCT: 35.5 % — ABNORMAL LOW (ref 39.0–52.0)
Hemoglobin: 12.1 g/dL — ABNORMAL LOW (ref 13.0–17.0)
MCH: 29.9 pg (ref 26.0–34.0)
MCHC: 34.1 g/dL (ref 30.0–36.0)
RBC: 4.05 MIL/uL — ABNORMAL LOW (ref 4.22–5.81)

## 2012-03-22 LAB — BASIC METABOLIC PANEL
BUN: 15 mg/dL (ref 6–23)
Chloride: 108 mEq/L (ref 96–112)
GFR calc Af Amer: >90 mL/min (ref >90)
Glucose, Bld: 98 mg/dL (ref 70–99)
Potassium: 3.7 mEq/L (ref 3.5–5.1)

## 2012-03-22 LAB — LIPID PANEL
LDL Cholesterol: 76 mg/dL (ref 0–99)
Triglycerides: 92 mg/dL (ref <150)

## 2012-03-22 MED ORDER — WHITE PETROLATUM GEL
Status: AC
  Administered 2012-03-22: 23:00:00
  Filled 2012-03-22: qty 5

## 2012-03-22 MED ORDER — CLOPIDOGREL BISULFATE 75 MG PO TABS
75.0000 mg | ORAL_TABLET | Freq: Every day | ORAL | Status: DC
  Administered 2012-03-22 – 2012-03-28 (×7): 75 mg via ORAL
  Filled 2012-03-22 (×12): qty 1

## 2012-03-22 MED ORDER — BOOST PLUS PO LIQD
237.0000 mL | Freq: Three times a day (TID) | ORAL | Status: DC
  Administered 2012-03-22 – 2012-03-25 (×5): 237 mL via ORAL
  Filled 2012-03-22 (×13): qty 237

## 2012-03-22 NOTE — Progress Notes (Signed)
Physical Therapy Treatment Patient Details Name: Eric Gardner MRN: [id number] DOB: 12-Dec-1960 Today's Date: 03/22/2012 Time: 1350-1406 PT Time Calculation (min): 16 min  PT Assessment / Plan / Recommendation Comments on Treatment Session  Patient motivated to increase independence. Will need to continue to work on high level balance as patient scored 15/24 on DGI putting him at an increased risk for falling.     Follow Up Recommendations  Inpatient Rehab (my progress to well for CIR)    Barriers to Discharge        Equipment Recommendations  None recommended by PT;None recommended by OT    Recommendations for Other Services Rehab consult  Frequency Min 4X/week   Plan Discharge plan remains appropriate    Precautions / Restrictions Precautions Precautions: Fall   Pertinent Vitals/Pain     Mobility  Bed Mobility Supine to Sit: 6: Modified independent (Device/Increase time) Sitting - Scoot to Edge of Bed: 6: Modified independent (Device/Increase time) Sit to Supine: 6: Modified independent (Device/Increase time) Transfers Sit to Stand: 6: Modified independent (Device/Increase time) Stand to Sit: 6: Modified independent (Device/Increase time) Ambulation/Gait Ambulation/Gait Assistance: 4: Min assist Ambulation Distance (Feet): 500 Feet Assistive device: None Ambulation/Gait Assistance Details: See DGI for gait details. A for LOB x2 especially with turns Gait Pattern: Decreased stride length    Exercises     PT Diagnosis:    PT Problem List:   PT Treatment Interventions:     PT Goals Acute Rehab PT Goals PT Goal: Supine/Side to Sit - Progress: Met PT Goal: Sit to Supine/Side - Progress: Met PT Goal: Sit to Stand - Progress: Met PT Goal: Stand to Sit - Progress: Met PT Goal: Ambulate - Progress: Progressing toward goal Additional Goals Additional Goal #1: Patient will score 19/24 on DGI to show decreased risk for falling  Visit Information  Last PT Received On:  03/22/12 Assistance Needed: +1    Subjective Data  Subjective: I am ready to get out of this bed   Cognition  Overall Cognitive Status: Appears within functional limits for tasks assessed/performed Arousal/Alertness: Awake/alert Orientation Level: Appears intact for tasks assessed Behavior During Session: [facility] for tasks performed    Balance  Standardized Balance Assessment Standardized Balance Assessment: Dynamic Gait Index Dynamic Gait Index Level Surface: Mild Impairment Change in Gait Speed: Mild Impairment Gait with Horizontal Head Turns: Mild Impairment Gait with Vertical Head Turns: Mild Impairment Gait and Pivot Turn: Moderate Impairment Step Over Obstacle: Mild Impairment Step Around Obstacles: Mild Impairment Steps: Mild Impairment Total Score: 15   End of Session PT - End of Session Equipment Utilized During Treatment: Gait belt Activity Tolerance: Patient tolerated treatment well Patient left: in bed Nurse Communication: Mobility status    Fredrich Birks 03/22/2012, 2:26 PM6/07/2012 Fredrich Birks PTA [phone] pager [phone] office

## 2012-03-22 NOTE — Evaluation (Signed)
Speech Language Pathology Evaluation Patient Details Name: Eric Gardner MRN: [id number] DOB: [birth year] Today's Date: 03/22/2012 Time: 1035-1050 SLP Time Calculation (min): 15 min  Problem List:  Patient Active Problem List  Diagnoses  . HIV DISEASE  . PNEUMOCYSTIS PNEUMONIA  . MITRAL VALVE PROLAPSE  . PNEUMONIA, HX OF  . CVA (cerebral infarction)   Past Medical History:  Past Medical History  Diagnosis Date  . Hypertension   . HIV (human immunodeficiency virus infection)    Past Surgical History: History reviewed. No pertinent past surgical history. HPI:  51 year old male admitted 03/20/12 with facial droop and left sided weakness. MRI revealed a right infarct.  PMH significant for HTN, HIV, cocaine and marijuana use, tobacco use   Assessment / Plan / Recommendation Clinical Impression  Pt. exhibits mild dysarthria with distorted phonemes, primarily fricatives.  Language and cognition are WFL's for areas assessed.  Pt. would benefit from ST to teach compensatory strategies for intelligibility need in more demanding environments.  Pt. is very motivated to improve his physical and speech abilities     SLP Assessment  Patient needs continued Speech Lanaguage Pathology Services    Follow Up Recommendations  Inpatient Rehab    Frequency and Duration min 2x/week  2 weeks       SLP Goals  SLP Goals Potential to Achieve Goals: Good Potential Considerations: Financial resources Progress/Goals/Alternative treatment plan discussed with pt/caregiver and they: Agree SLP Goal #1: Pt. will recall and demonstrated speech strategies in conversation with min verbal cues. SLP Goal #2: Pt. will identify and self correct utterances of decreased intelligibility with min verbal cues.     SLP Evaluation Prior Functioning  Cognitive/Linguistic Baseline: Within functional limits Type of Home: House Lives With:  (did have roommate.  In process of moving out to live alone) Available Help  at Discharge: Family (nephews, brothers and sisters) Education:  (completed 12th) Vocation: On disability   Cognition  Overall Cognitive Status: Appears within functional limits for tasks assessed Arousal/Alertness: Awake/alert Orientation Level: Oriented X4    Comprehension  Auditory Comprehension Overall Auditory Comprehension: Appears within functional limits for tasks assessed Visual Recognition/Discrimination Discrimination: Not tested Reading Comprehension Reading Status: Within funtional limits    Expression Expression Primary Mode of Expression: Verbal Verbal Expression Overall Verbal Expression: Appears within functional limits for tasks assessed Pragmatics: No impairment Non-Verbal Means of Communication: Not applicable Written Expression Dominant Hand: Right Written Expression: Not tested   Oral / Motor Oral Motor/Sensory Function Overall Oral Motor/Sensory Function:  (see bedside swallow results) Motor Speech Overall Motor Speech: Impaired Respiration: Within functional limits Phonation: Normal Resonance: Within functional limits Articulation: Within functional limitis Intelligibility: Intelligibility reduced Word: 75-100% accurate Phrase: 75-100% accurate Sentence: 75-100% accurate Conversation: 75-100% accurate Motor Planning: Witnin functional limits Motor Speech Errors: Not applicable     Royce Macadamia M.Ed [employer] [phone]  03/22/2012

## 2012-03-22 NOTE — Clinical Documentation Improvement (Signed)
BMI DOCUMENTATION CLARIFICATION QUERY  THIS DOCUMENT IS NOT A PERMANENT PART OF THE MEDICAL RECORD  TO RESPOND TO THE THIS QUERY, FOLLOW THE INSTRUCTIONS BELOW:  1. If needed, update documentation for the patient's encounter via the notes activity.  2. Access this query again and click edit on the In [employer].  3. After updating, or not, click F2 to complete all highlighted (required) fields concerning your review. Select "additional documentation in the medical record" OR "no additional documentation provided".  4. Click Sign note button.  5. The deficiency will fall out of your In Basket *Please let us know if you are not able to complete this workflow by phone or e-mail (listed below).         03/22/12  Dear Dr. Janee Morn Marton Redwood  In an effort to better capture your patient's severity of illness, reflect appropriate length of stay and utilization of resources, a review of the patient medical record has revealed the following indicators.  THANK YOU FOR YOUR RESPONSE TO THIS QUERY.  Possible Clinical conditions - Underweight:  BMI = or < 19.0  - Other Condition (please specify) - Cannot Clinically Determine  Cannot Clinically determine _____________ - Risk Factors: HIV Disease - BMI = 16.8 on Doc Flowsheet 6/9    Reviewed:   Thank You,  Beverley Fiedler RN Clinical Documentation Specialist: Pager: [phone] HIM: [phone] Health Information Management [place]

## 2012-03-22 NOTE — Progress Notes (Signed)
Stroke Team Progress Note  HISTORY  Eric Gardner is an 51 y.o. male developed LUE and left facial numbness around 20:00 03/19/12. Woke in am unable to move left arm, unable to chew due to numbness, and slurred speech. He did not come to the ER initially because he thought he'd get better. 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 Risk factors include tobacco use, cocaine use, HTN and HIV.    SUBJECTIVE Left sided numbness. Sitting up in bed answering questions appropriately. Overall he feels his condition is gradually improving. He admits to polysubstance abuse including smoking. He vows that he is not going to smoke after this admission.   Past Medical History   Diagnosis  Date   .  Hypertension    .  HIV (human immunodeficiency virus infection)      Social History: Smokes. He does not have any smokeless tobacco history on file. Cocaine use  Allergies: No Known Allergies    OBJECTIVE Most recent Vital Signs: Filed Vitals:   03/21/12 2137 03/22/12 0221 03/22/12 0524 03/22/12 0842  BP: 124/81 114/69 117/74 129/82  Pulse: 63 65 67 61  Temp: 98.4 F (36.9 C) 98.4 F (36.9 C) 98.4 F (36.9 C) 98.2 F (36.8 C)  TempSrc: Oral Oral Oral Oral  Resp: 18 19 18 18   Height:      Weight:      SpO2: 98% 97% 97% 97%   CBG (last 3)   Basename 03/22/12 0729 03/21/12 2132 03/20/12 2126  GLUCAP 96 93 83   Intake/Output from previous day: 06/09 0701 - 06/10 0700 In: 300 [I.V.:300] Out: 350 [Urine:350]  IV Fluid Intake:     . sodium chloride 75 mL/hr at 03/22/12 0321  . sodium chloride 75 mL/hr at 03/21/12 1720    MEDICATIONS    . aspirin  300 mg Rectal Daily   Or  . aspirin  325 mg Oral Daily  . efavirenz-emtrictabine-tenofovir  1 tablet Oral QHS   PRN:  acetaminophen, acetaminophen, ondansetron (ZOFRAN) IV, senna-docusate  Diet:   dysphagia diet Activity:  Up with physicial therapy, expects to be a rehab candidate DVT Prophylaxis:  ASA  325mg  daily or rectally if needed.  CLINICALLY SIGNIFICANT STUDIES Basic Metabolic Panel:  Lab 03/22/12 2956 03/20/12 2206 03/20/12 2107  NA 141 144 --  K 3.7 3.8 --  CL 108 106 --  CO2 24 -- 24  GLUCOSE 98 61* --  BUN 15 24* --  CREATININE 1.00 1.30 --  CALCIUM 8.0* -- 9.6  MG -- -- --  PHOS -- -- --   Liver Function Tests:  Lab 03/20/12 2107  AST 20  ALT 20  ALKPHOS 84  BILITOT 0.3  PROT 7.7  ALBUMIN 4.4   CBC:  Lab 03/22/12 0211 03/20/12 2206 03/20/12 2107  WBC 2.7* -- 3.9*  NEUTROABS -- -- 1.1*  HGB 12.1* 17.0 --  HCT 35.5* 50.0 --  MCV 87.7 -- 86.9  PLT 155 -- 204   Coagulation:  Lab 03/20/12 2107  LABPROT 12.7  INR 0.93   Cardiac Enzymes:  Lab 03/22/12 0211 03/21/12 2000 03/21/12 1336  CKTOTAL 127 131 178  CKMB 2.0 2.1 2.5  CKMBINDEX -- -- --  TROPONINI <0.30 <0.30 <0.30   Urinalysis: No results found for this basename: COLORURINE:2,APPERANCEUR:2,LABSPEC:2,PHURINE:2,GLUCOSEU:2,HGBUR:2,BILIRUBINUR:2,KETONESUR:2,PROTEINUR:2,UROBILINOGEN:2,NITRITE:2,LEUKOCYTESUR:2 in the last 168 hours Lipid Panel    Component Value Date/Time   CHOL 140 03/22/2012 0211   TRIG 92 03/22/2012 0211   HDL 46 03/22/2012  0211   CHOLHDL 3.0 03/22/2012 0211   VLDL 18 03/22/2012 0211   LDLCALC 76 03/22/2012 0211   HgbA1C  No results found for this basename: HGBA1C    Urine Drug Screen:     Component Value Date/Time   LABOPIA NONE DETECTED 03/20/2012 2133   COCAINSCRNUR POSITIVE* 03/20/2012 2133   LABBENZ NONE DETECTED 03/20/2012 2133   AMPHETMU NONE DETECTED 03/20/2012 2133   THCU NONE DETECTED 03/20/2012 2133   LABBARB NONE DETECTED 03/20/2012 2133    CT of the brain  Focus of hypoattenuation within the high right frontal lobe, corresponds to the location of restricted diffusion on the 2010 MRI. Therefore, may represent sequelae of prior infection or infarction. However, if there is persistent concern for an acute intracranial process,   CT angio of the brain   MRI of the brain   Findings consistent with acute right posterior frontal cortical infarction. No hemorrhage.   MRA of the brain No proximal stenosis, dissection or occlusion. Suspect diminished or absent flow related enhancement right posterior frontal MCA branch.   2D Echocardiogram  Pending results  Carotid Doppler  Pending results  CXR  No active cardiopulmonary process.    EKG  Pending results, should be done this am.   Therapy Recommendations Pt will make an excellent rehab candidate for further therapy prior to d/c. Pt will benefit from skilled PT in the acute care setting in order to maximize functional mobility, strength and safety. Review of diet/speech therapy, patient tolerating diet and progressing.   OT:  Pt. exhibits mild dysarthria with distorted phonemes, primarily fricatives. Language and cognition are WFL's for areas assessed. Pt. would benefit from ST: to teach compensatory strategies for intelligibility need in more demanding environments.   ST;Pt. exhibits mild dysarthria with distorted phonemes, primarily fricatives. Language and cognition are WFL's for areas assessed. Pt. would benefit from ST to teach compensatory strategies for intelligibility need in more demanding environments. Pt. is very motivated to improve his physical and speech abilities    Inpatient Rehab Recommended by therapy services.    Physical Exam young [place] American male not in distress. Looks malnourished and cachectic. Awake alert. Afebrile. Head is nontraumatic. Neck is supple without bruit. Hearing is normal. Cardiac exam no murmur or gallop. Lungs are clear to auscultation. Distal pulses are well felt.  Neurological Exam : Awake alert oriented x 3 normal speech and language .normal visual acuity and fields. Fundi not visualized.. Mild left lower face asymmetry. Tongue midline. No drift. Mild diminished fine finger movements on left. Orbits right over left upper extremity. Mild left grip weak.. Normal  sensation . Normal coordination. Gait not tested ASSESSMENT Mr. Eric Gardner is a 51 y.o. male with a right posterior frontal infarct etiology unknown. Multiple risks factors including polysubstance abuse including vasculitis associated with HIV, small vessel disease, embolic stroke, or vasospasm that can be seen with cocaine use. Will await on all testing to be complete .  On Asa 325mg  daily at home prior to admission. Will need to change to plavix due to aspirin failure as outpatient  Hospital day # 2  TREATMENT/PLAN -Add clopidogrel 75 mg orally every day for secondary stroke prevention. -Counsel re: cessation of polysubstance abuse including cocaine, smoking. -await complete stroke testing. -rehab consult in place.   Guy Franco, [facility]  MBA, [facility]  [phone]  I certify that I have personally taken history, examine this patient, reviewed data and are participated in developing plan of care and degree with  above. Dr. Delia Heady

## 2012-03-22 NOTE — Progress Notes (Signed)
INITIAL ADULT NUTRITION ASSESSMENT Date: 03/22/2012   Time: 12:31 PM Reason for Assessment: Nutrition Risk-severe malnutrition  ASSESSMENT: Male 51 y.o.  Dx: CVA (cerebral infarction)  Hx:  Past Medical History  Diagnosis Date  . Hypertension   . HIV (human immunodeficiency virus infection)   History reviewed. No pertinent past surgical history.  Related Meds:     . clopidogrel  75 mg Oral Q breakfast  . efavirenz-emtrictabine-tenofovir  1 tablet Oral QHS  . DISCONTD: aspirin  300 mg Rectal Daily  . DISCONTD: aspirin  325 mg Oral Daily    Ht: 5\' 9"  (175.3 cm)  Wt: 113 lb 8 oz (51.483 kg)  Ideal Wt: 72.7 kg % Ideal Wt: 71%  Usual Wt: 120 lbs Wt Readings from Last 10 Encounters:  03/21/12 113 lb 8 oz (51.483 kg)  11/17/07 124 lb 6.4 oz (56.427 kg)  09/29/07 120 lb 14.4 oz (54.84 kg)   % Usual Wt: 94%  Body mass index is 16.76 kg/(m^2). Underweight  Food/Nutrition Related Hx: Pt with 6% wt loss over the last few weeks. Per pt he has been more stressed and depressed the last few weeks, which has contributed to a poor appetite. 24 hr recall reviewed and pt has been consuming </= 50% of his needs. Pt also reports that he has never ate well when the temperature gets hot outside. Pt also reports recent drug use. Pt has visible severe wasting of his temple and clavicle muscles.  Pt meets criteria for severe malnutrition in the context of his chronic illness as evidenced by 6% wt loss in the last few weeks, po intake of </= 50% of his estimated needs and severe wasting of his temples and clavicles.   Labs:  CMP     Component Value Date/Time   NA 141 03/22/2012 0211   K 3.7 03/22/2012 0211   CL 108 03/22/2012 0211   CO2 24 03/22/2012 0211   GLUCOSE 98 03/22/2012 0211   BUN 15 03/22/2012 0211   CREATININE 1.00 03/22/2012 0211   CALCIUM 8.0* 03/22/2012 0211   PROT 7.7 03/20/2012 2107   ALBUMIN 4.4 03/20/2012 2107   AST 20 03/20/2012 2107   ALT 20 03/20/2012 2107   ALKPHOS 84 03/20/2012  2107   BILITOT 0.3 03/20/2012 2107   GFRNONAA 86* 03/22/2012 0211   GFRAA >90 03/22/2012 0211   CBG (last 3)   Basename 03/22/12 1133 03/22/12 0729 03/21/12 2132  GLUCAP 81 96 93   Lipid Panel     Component Value Date/Time   CHOL 140 03/22/2012 0211   TRIG 92 03/22/2012 0211   HDL 46 03/22/2012 0211   CHOLHDL 3.0 03/22/2012 0211   VLDL 18 03/22/2012 0211   LDLCALC 76 03/22/2012 0211   No results found for this basename: HGBA1C    Intake/Output Summary (Last 24 hours) at 03/22/12 1237 Last data filed at 03/22/12 9604  Gross per 24 hour  Intake    540 ml  Output    350 ml  Net    190 ml    Diet Order: Dysphagia 3/Thin  Supplements/Tube Feeding: none  IVF:    sodium chloride Last Rate: 75 mL/hr at 03/22/12 0321  sodium chloride Last Rate: 75 mL/hr at 03/21/12 1720    Estimated Nutritional Needs:   Kcal: 1700-1900 Protein: 80-90 Fluid: >1.7L/day  NUTRITION DIAGNOSIS: -Inadequate oral intake (NI-2.1).  Status: Ongoing  RELATED TO: decreased appetite, stress  AS EVIDENCE BY: limited intake at meals and 6% wt loss PTA.  MONITORING/EVALUATION(Goals): Goal: Provide >/= 90% of her estimated needs. Monitor: po intake, supplement tolerance, weight  EDUCATION NEEDS: -Education needs addressed  INTERVENTION:  Boost Plus, chocolate, TID  Discussed with pt importance of adequate nutrition. Encouraged pt to drink supplement if eating less during the summer months or when appetite is poor. Pt agreeable.   Dietitian [phone]  DOCUMENTATION CODES Per approved criteria  -Severe malnutrition in the context of chronic illness -Underweight    Kendell Bane Cornelison 03/22/2012, 12:31 PM

## 2012-03-22 NOTE — Progress Notes (Signed)
VASCULAR LAB PRELIMINARY  PRELIMINARY  PRELIMINARY  PRELIMINARY  Carotid Dopplers completed.    Preliminary report:  There is no ICA stenosis.  Vertebral artery flow is antegrade.  Sherren Kerns [place], 03/22/2012, 4:37 PM

## 2012-03-22 NOTE — Progress Notes (Signed)
Subjective: Patient states some improvement with weakness.  Objective: Vital signs in last 24 hours: Filed Vitals:   03/22/12 0524 03/22/12 0842 03/22/12 1105 03/22/12 1421  BP: 117/74 129/82 139/89 125/77  Pulse: 67 61 60 64  Temp: 98.4 F (36.9 C) 98.2 F (36.8 C) 98.4 F (36.9 C) 97.2 F (36.2 C)  TempSrc: Oral Oral Oral Oral  Resp: 18 18 18 18   Height:      Weight:      SpO2: 97% 97% 99% 99%    Intake/Output Summary (Last 24 hours) at 03/22/12 1548 Last data filed at 03/22/12 0833  Gross per 24 hour  Intake    540 ml  Output    350 ml  Net    190 ml    Weight change:   General: Alert, awake, oriented x3, in no acute distress. HEENT: No bruits, no goiter. Heart: Regular rate and rhythm, without murmurs, rubs, gallops. Lungs: Clear to auscultation bilaterally. Abdomen: Soft, nontender, nondistended, positive bowel sounds. Extremities: No clubbing cyanosis or edema with positive pedal pulses. Neuro: L facial droop.   Lab Results:  Basename 03/22/12 0211 03/20/12 2206 03/20/12 2107  NA 141 144 --  K 3.7 3.8 --  CL 108 106 --  CO2 24 -- 24  GLUCOSE 98 61* --  BUN 15 24* --  CREATININE 1.00 1.30 --  CALCIUM 8.0* -- 9.6  MG -- -- --  PHOS -- -- --    Basename 03/20/12 2107  AST 20  ALT 20  ALKPHOS 84  BILITOT 0.3  PROT 7.7  ALBUMIN 4.4   No results found for this basename: LIPASE:2,AMYLASE:2 in the last 72 hours  Basename 03/22/12 0211 03/20/12 2206 03/20/12 2107  WBC 2.7* -- 3.9*  NEUTROABS -- -- 1.1*  HGB 12.1* 17.0 --  HCT 35.5* 50.0 --  MCV 87.7 -- 86.9  PLT 155 -- 204    Basename 03/22/12 1404 03/22/12 0908 03/22/12 0211  CKTOTAL 156 125 127  CKMB 1.7 1.7 2.0  CKMBINDEX -- -- --  TROPONINI <0.30 <0.30 <0.30   No components found with this basename: POCBNP:3 No results found for this basename: DDIMER:2 in the last 72 hours No results found for this basename: HGBA1C:2 in the last 72 hours  Basename 03/22/12 0211  CHOL 140  HDL 46    LDLCALC 76  TRIG 92  CHOLHDL 3.0  LDLDIRECT --   No results found for this basename: TSH,T4TOTAL,FREET3,T3FREE,THYROIDAB in the last 72 hours No results found for this basename: VITAMINB12:2,FOLATE:2,FERRITIN:2,TIBC:2,IRON:2,RETICCTPCT:2 in the last 72 hours  Micro Results: No results found for this or any previous visit (from the past 240 hour(s)).  Studies/Results: Dg Chest 2 View  03/21/2012  *RADIOLOGY REPORT*  Clinical Data: Stroke.  Smoker.  No chest complaints.  CHEST - 2 VIEW  Comparison: 08/01/2009 radiographs.  Findings: Narrow AP diameter of the chest. The heart size and mediastinal contours are normal. The lungs are clear. There is no pleural effusion or pneumothorax. No acute osseous findings are identified.  Prominent nipple shadows are noted bilaterally.  IMPRESSION: No active cardiopulmonary process.  Original Report Authenticated By: Gerrianne Scale, M.D.   Ct Head Wo Contrast  03/20/2012  *RADIOLOGY REPORT*  Clinical Data: Left arm weakness  CT HEAD WITHOUT CONTRAST  Technique:  Contiguous axial images were obtained from the base of the skull through the vertex without contrast.  Comparison: 08/03/2009 MRI  Findings: Focus of hypoattenuation within the high right frontal lobe, corresponds to the location of  a focus of restricted diffusion on the 2010 MRI.  There is no evidence for acute hemorrhage, overt hydrocephalus, mass lesion, or abnormal extra-axial fluid collection.  No definite CT evidence for acute cortical based (large artery) infarction. The The visualized paranasal sinuses and mastoid air cells are predominately clear.  No displaced calvarial fracture.  IMPRESSION: Focus of hypoattenuation within the high right frontal lobe, corresponds to the location of restricted diffusion on the 2010 MRI. Therefore, may represent sequelae of prior infection or infarction.  However, if there is persistent concern for an acute intracranial process, MRI follow-up is recommend.   Original Report Authenticated By: Waneta Martins, M.D.   Mr Brain Wo Contrast  03/21/2012  *RADIOLOGY REPORT*  Clinical Data:  Left facial droop and left-sided weakness of unknown duration. HIV positive.  Hypertension.  Positive drug screen for cocaine.  MRI HEAD WITHOUT CONTRAST MRA HEAD WITHOUT CONTRAST  Technique:  Multiplanar, multiecho pulse sequences of the brain and surrounding structures were obtained without intravenous contrast. Angiographic images of the head were obtained using MRA technique without contrast.  Comparison:  CT head 03/20/2012.  MRI head 08/03/2009.  MRI HEAD  Findings:  There is an acute 11 x 26 mm region of acute infarction affecting the right posterior frontal cortex near the central sulcus.  There is no hemorrhage, mass lesion, hydrocephalus, or extra-axial fluid.  There is slight premature atrophy. Scattered foci of prolonged T2 signal and the subcortical and periventricular white matter signal are nonspecific, possibly representing chronic microvascular ischemic change due hypertension, chronic infection, or idiopathic. No midline shift.  Major intracranial vascular structures are patent.  Skull base and calvarium negative.  Unremarkable pituitary and cerebellar tonsils.  Contrast was not administered.  Compared with 2010, this infarct is slightly more posterior and correlates with the observed cytotoxic edema demonstrated on yesterday's CT.  IMPRESSION: Findings consistent with acute right posterior frontal cortical infarction.  No hemorrhage.  MRA HEAD  Findings: There are no focal lesions of the skull base, cavernous, or supraclinoid internal carotid arteries.  No visible evidence for dissection or pseudoaneurysm.  The basilar arteries widely patent.  Vertebrals are codominant.  There is no proximal stenosis of the anterior, middle, or posterior cerebral arteries.  No cerebellar branch occlusion is observed.  On submentovertex projections of the anterior circulation, there  is a suggestion of diminished caliber or possibly absence of a right M3 or M4 MCA vessel subserving the area of acute infarction.  It is unclear if this represents spasm or occlusion.  Formal catheter angiogram could be helpful in further evaluation.  IMPRESSION: No proximal stenosis, dissection or occlusion. Suspect diminished or absent flow related enhancement right posterior frontal MCA branch.  Original Report Authenticated By: Elsie Stain, M.D.   Mr Mra Head/brain Wo Cm  03/21/2012  *RADIOLOGY REPORT*  Clinical Data:  Left facial droop and left-sided weakness of unknown duration. HIV positive.  Hypertension.  Positive drug screen for cocaine.  MRI HEAD WITHOUT CONTRAST MRA HEAD WITHOUT CONTRAST  Technique:  Multiplanar, multiecho pulse sequences of the brain and surrounding structures were obtained without intravenous contrast. Angiographic images of the head were obtained using MRA technique without contrast.  Comparison:  CT head 03/20/2012.  MRI head 08/03/2009.  MRI HEAD  Findings:  There is an acute 11 x 26 mm region of acute infarction affecting the right posterior frontal cortex near the central sulcus.  There is no hemorrhage, mass lesion, hydrocephalus, or extra-axial fluid.  There is slight premature atrophy.  Scattered foci of prolonged T2 signal and the subcortical and periventricular white matter signal are nonspecific, possibly representing chronic microvascular ischemic change due hypertension, chronic infection, or idiopathic. No midline shift.  Major intracranial vascular structures are patent.  Skull base and calvarium negative.  Unremarkable pituitary and cerebellar tonsils.  Contrast was not administered.  Compared with 2010, this infarct is slightly more posterior and correlates with the observed cytotoxic edema demonstrated on yesterday's CT.  IMPRESSION: Findings consistent with acute right posterior frontal cortical infarction.  No hemorrhage.  MRA HEAD  Findings: There are no focal  lesions of the skull base, cavernous, or supraclinoid internal carotid arteries.  No visible evidence for dissection or pseudoaneurysm.  The basilar arteries widely patent.  Vertebrals are codominant.  There is no proximal stenosis of the anterior, middle, or posterior cerebral arteries.  No cerebellar branch occlusion is observed.  On submentovertex projections of the anterior circulation, there is a suggestion of diminished caliber or possibly absence of a right M3 or M4 MCA vessel subserving the area of acute infarction.  It is unclear if this represents spasm or occlusion.  Formal catheter angiogram could be helpful in further evaluation.  IMPRESSION: No proximal stenosis, dissection or occlusion. Suspect diminished or absent flow related enhancement right posterior frontal MCA branch.  Original Report Authenticated By: Elsie Stain, M.D.    Medications:     . clopidogrel  75 mg Oral Q breakfast  . efavirenz-emtrictabine-tenofovir  1 tablet Oral QHS  . lactose free nutrition  237 mL Oral TID WC  . DISCONTD: aspirin  300 mg Rectal Daily  . DISCONTD: aspirin  325 mg Oral Daily    Assessment: Principal Problem:  *CVA (cerebral infarction) Active Problems:  HIV DISEASE   Plan: #1 acute right posterior frontal cortical infarct Per MRI. 2-D echo is pending. Carotid Dopplers are pending. The importance of tobacco cessation and polysubstance abuse with counseled to the patient. Aspirin has been changed to Plavix. PT OT. Follow. Neurology following and appreciate input and recommendations.  #2 HIV Stable. Continue Atripla. Follow.   LOS: 2 days   Toni Hoffmeister [phone] 03/22/2012, 3:48 PM

## 2012-03-22 NOTE — Progress Notes (Signed)
Rehab admissions - Evaluated for possible admission.  Please see rehab consult done today by Dr. Wynn Banker.  Patient already doing too well for acute inpatient rehab stay.  Recommend outpatient therapy follow-up.  Call me for questions.  #[phone]

## 2012-03-22 NOTE — Evaluation (Signed)
Occupational Therapy Evaluation Patient Details Name: Eric Gardner MRN: [id number] DOB: 1961-07-19 Today's Date: 03/22/2012 Time: 7829-5621 OT Time Calculation (min): 21 min  OT Assessment / Plan / Recommendation Clinical Impression  51 y.o. male with persistent left sided weakness, paresthesia and dysarthria.   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 Pt presents with left sided weakness (LE > UE), right eye visual changes, decrease balance and overall decreased independence with ADL. Pt will benefit from skilled OT in the acute setting to maximize I with ADL and ADL mobility prior to d/c    OT Assessment  Patient needs continued OT Services    Follow Up Recommendations  Inpatient Rehab (vs Home with HHOT)    Barriers to Discharge Decreased caregiver support    Equipment Recommendations  None recommended by OT at this time    Recommendations for Other Services    Frequency  Min 2X/week    Precautions / Restrictions Precautions Precautions: Fall Restrictions Weight Bearing Restrictions: No   Pertinent Vitals/Pain Pt with no c/o pain during eval. HR remained steady in mid 70's    ADL  Grooming: Performed;Wash/dry hands;Min guard Where Assessed - Grooming: Supported standing Upper Body Bathing: Simulated;Set up;Supervision/safety Where Assessed - Upper Body Bathing: Unsupported sitting Lower Body Bathing: Simulated;Minimal assistance Where Assessed - Lower Body Bathing: Supported sit to stand Lower Body Dressing: Performed;Set up;Supervision/safety Where Assessed - Lower Body Dressing: Unsupported sitting (don socks) Toilet Transfer: Performed;Min guard Toilet Transfer Method: Sit to Barista: Regular height toilet;Grab bars Toileting - Clothing Manipulation and Hygiene: Performed;Min guard Where Assessed - Engineer, mining and Hygiene: Standing Equipment Used: Rolling walker;Gait  belt Transfers/Ambulation Related to ADLs: Pt Min guard A with RW ambulation throughout room    OT Diagnosis: Generalized weakness  OT Problem List: Decreased strength;Decreased activity tolerance;Decreased range of motion;Impaired balance (sitting and/or standing);Impaired vision/perception;Decreased knowledge of use of DME or AE;Impaired UE functional use;Impaired sensation OT Treatment Interventions: Self-care/ADL training;DME and/or AE instruction;Therapeutic activities;Visual/perceptual remediation/compensation;Patient/family education;Balance training   OT Goals Acute Rehab OT Goals OT Goal Formulation: With patient Time For Goal Achievement: 03/29/12 Potential to Achieve Goals: Good ADL Goals Pt Will Perform Grooming: Independently;Standing at sink ADL Goal: Grooming - Progress: Goal set today Pt Will Perform Lower Body Bathing: with modified independence;Sit to stand from chair;Sit to stand from bed ADL Goal: Lower Body Bathing - Progress: Goal set today Pt Will Perform Upper Body Dressing: Independently;Sitting, bed;Sitting, chair ADL Goal: Upper Body Dressing - Progress: Goal set today Pt Will Perform Lower Body Dressing: Independently;Sit to stand from chair;Sit to stand from bed ADL Goal: Lower Body Dressing - Progress: Goal set today Pt Will Transfer to Toilet: with modified independence;Ambulation;with DME ADL Goal: Toilet Transfer - Progress: Goal set today Pt Will Perform Toileting - Clothing Manipulation: Independently;Standing ADL Goal: Toileting - Clothing Manipulation - Progress: Goal set today Pt Will Perform Tub/Shower Transfer: Tub transfer;Ambulation;with modified independence;Independently (DME prn) ADL Goal: Tub/Shower Transfer - Progress: Goal set today Additional ADL Goal #1: Pt will participate in further vision testing and subsequent goal setting. ADL Goal: Additional Goal #1 - Progress: Goal set today  Visit Information  Last OT Received On:  03/22/12 Assistance Needed: +1    Subjective Data  Subjective: I didn't really get any sleep last night Patient Stated Goal: Return home   Prior Functioning  Home Living Lives With:  (did have roommate.  In process of moving out to live alone) Available Help  at Discharge: Family (nephews, brothers and sisters) Type of Home: House Home Access: Stairs to enter Secretary/administrator of Steps: 4 Entrance Stairs-Rails: Left Home Layout: Two level Alternate Level Stairs-Number of Steps: 13 Alternate Level Stairs-Rails: Right Bathroom Shower/Tub: Tub/shower unit;Curtain Bathroom Toilet: Standard Bathroom Accessibility: Yes How Accessible: Accessible via walker Home Adaptive Equipment: None Prior Function Level of Independence: Independent Able to Take Stairs?: Yes Driving: Yes Vocation: On disability Communication Communication: No difficulties Dominant Hand: Right    Cognition  Overall Cognitive Status: Appears within functional limits for tasks assessed/performed Arousal/Alertness: Awake/alert Orientation Level: Appears intact for tasks assessed Behavior During Session: [facility] for tasks performed    Extremity/Trunk Assessment Right Upper Extremity Assessment RUE ROM/Strength/Tone: Within functional levels RUE Sensation: WFL - Light Touch RUE Coordination: WFL - gross/fine motor Left Upper Extremity Assessment LUE ROM/Strength/Tone: Within functional levels LUE Sensation: Deficits LUE Sensation Deficits: reports "numbness" on left hemibody from face inferiorly LUE Coordination: WFL - gross motor   Mobility Bed Mobility Supine to Sit: 5: Supervision;HOB flat;With rails Sit to Supine: 5: Supervision;With rail;HOB flat Transfers Sit to Stand: 4: Min guard;From bed Stand to Sit: 4: Min guard;To bed   Exercise    Balance    End of Session OT - End of Session Equipment Utilized During Treatment: Gait belt Activity Tolerance: Patient tolerated treatment well Patient left:  in bed;with call bell/phone within reach   Luxe Cuadros 03/22/2012, 11:36 AM

## 2012-03-22 NOTE — Clinical Social Work Psychosocial (Signed)
Clinical Social Work Department BRIEF PSYCHOSOCIAL ASSESSMENT 03/22/2012  Patient:  Eric Gardner, Eric Gardner     Account Number:  [id number]     Admit date:  03/20/2012  Clinical Social Worker:  Andres Shad  Date/Time:  03/22/2012 02:38 PM  Referred by:  Physician  Date Referred:  03/22/2012 Referred for  Substance Abuse   Other Referral:   New stroke and IP rehab vs other dc options   Interview type:  Patient Other interview type:   Chart Review    PSYCHOSOCIAL DATA Living Status:  FRIEND(S) Admitted from facility:   Level of care:   Primary support name:  [place] Dec Primary support relationship to patient:  FAMILY Degree of support available:   Patient is originally from [place], reports downtown. Reports he was up here visiting his niece because she is graduating from high school.  Reports all family is in [place], thus strong support, however limited positive supports in the [place] area    CURRENT CONCERNS Current Concerns  Adjustment to Illness  Substance Abuse  Post-Acute Placement   Other Concerns:    SOCIAL WORK ASSESSMENT / PLAN Met with patient at the bedside and educated patient on CSW role, along with reason for referral.  patient was very embarrassed per report and limited eye contact on messing with cocaine and ending up here in the hospital.  Patient reports he does not use on a regular basis and reports he was just socially using with a friend.    Reports he is from downtown [place] area in which he lives.  He does not work, and was visiting family and a niece who graduated on Saturday.  Reports he is not an active drug user and is ashamed of his current condition and using.    Reports he is very motivated for inpatient rehab to complete physical therapy.  Voices he will be staying with his sister after discharge so he can get better, but also working to get out of his current living situation with roommates who are poor influencers.    Patient  reports he is glad he is alive and is willing to get better.  Reports he would be interested in some outpatient referrals in the [facility] area to stay clean and not relapse on Cocaine.    Patient is also 042 positive and has a primary infectious disease doctor and medication he is compliant with. Reports he will continue to follow up.    Patient was educated on benefits and criteria for inpatient rehab and patient was in his room reading educational material about Stroke and prevention.   Assessment/plan status:  Referral to [facility] Other assessment/ plan:   IP rehab to assess and to see if approrpiate for admission   Information/referral to community resources:   Patient will be given SA resources that he requested in the [place] area.  Discussed case with unit CSW Maralyn Sago.    PATIENT'S/FAMILY'S RESPONSE TO PLAN OF CARE: Patient is very appreciative of visit and also discussion of ceasing all substance abuse related activities.  Patient reports he is depressed just understanding and dealing with this new Stroke.  CSW normalized depressed feelings and explored any SI, mood changes and any mood medications. Patient declined all the above, but would consider taking something for his depressed mood.    Patient mood was depressed and flat, however his affect was hopeful that he would get better and motivated to complete rehab.  Will follow up as needed.   Ashley Jacobs, MSW LCSW [phone]  (  Coverage for unit CSW Dede Query)

## 2012-03-22 NOTE — Progress Notes (Signed)
  Echocardiogram 2D Echocardiogram has been performed.  Eric Gardner [facility] 03/22/2012, 4:32 PM

## 2012-03-22 NOTE — Consult Note (Signed)
Physical Medicine and Rehabilitation Consult Reason for Consult: CVA Referring Physician: [facility]   HPI: Eric Gardner is a 51 y.o. right-handed male with history of HIV and hypertension admitted 03/21/2012 with left-sided weakness and facial droop. Urine drug screen positive for cocaine. MRI findings consistent with acute right posterior frontal cortical infarction without hemorrhage. MRA of the head with no stenosis or occlusion. Echocardiogram and carotid Dopplers are pending. Neurology consulted placed on Plavix therapy. Physical and occupational therapy evaluations completed recommendations for physical medicine rehabilitation consult to consider inpatient rehabilitation services   Review of Systems  Constitutional: Positive for malaise/fatigue.  Gastrointestinal: Positive for constipation.  Neurological: Positive for weakness.  All other systems reviewed and are negative.   Past Medical History  Diagnosis Date  . Hypertension   . HIV (human immunodeficiency virus infection)    History reviewed. No pertinent past surgical history. History reviewed. No pertinent family history. Social History:  reports that he has been smoking.  He does not have any smokeless tobacco history on file. He reports that he does not drink alcohol or use illicit drugs. Allergies: No Known Allergies Medications Prior to Admission  Medication Sig Dispense Refill  . aspirin 325 MG tablet Take 650 mg by mouth once as needed. As needed for current stroke like symptoms.      Marland Kitchen efavirenz-emtrictabine-tenofovir (ATRIPLA) 600-200-300 MG per tablet Take 1 tablet by mouth at bedtime.        Home: Home Living Lives With:  (did have roommate.  In process of moving out to live alone) Available Help at Discharge: Family (nephews, brothers and sisters) Type of Home: House Home Access: Stairs to enter Secretary/administrator of Steps: 4 Entrance Stairs-Rails: Left Home Layout: Two level Alternate Level  Stairs-Number of Steps: 13 Alternate Level Stairs-Rails: Right Bathroom Shower/Tub: Tub/shower unit;Curtain Bathroom Toilet: Standard Bathroom Accessibility: Yes How Accessible: Accessible via walker Home Adaptive Equipment: None  Functional History: Prior Function Able to Take Stairs?: Yes Driving: Yes Vocation: On disability Functional Status:  Mobility: Bed Mobility Bed Mobility: Supine to Sit;Sitting - Scoot to Edge of Bed Supine to Sit: 5: Supervision;HOB flat;With rails Sitting - Scoot to Edge of Bed: 4: Min guard Sit to Supine: 5: Supervision;With rail;HOB flat Transfers Transfers: Sit to Stand;Stand to Sit Sit to Stand: 4: Min guard;From bed Stand to Sit: 4: Min guard;To bed Ambulation/Gait Ambulation/Gait Assistance: 4: Min assist;3: Mod assist Ambulation Distance (Feet): 120 Feet Assistive device: 1 person hand held assist Ambulation/Gait Assistance Details: Min assist for stability, requiring mod assist at times due to LOB. Gait Pattern: Scissoring;Narrow base of support;Decreased step length - right;Decreased stance time - left;Decreased hip/knee flexion - left Gait velocity: decreased gait speed    ADL: ADL Grooming: Performed;Wash/dry hands;Min guard Where Assessed - Grooming: Supported standing Upper Body Bathing: Simulated;Set up;Supervision/safety Where Assessed - Upper Body Bathing: Unsupported sitting Lower Body Bathing: Simulated;Minimal assistance Where Assessed - Lower Body Bathing: Supported sit to stand Lower Body Dressing: Performed;Set up;Supervision/safety Where Assessed - Lower Body Dressing: Unsupported sitting (don socks) Toilet Transfer: Performed;Min guard Statistician Method: Sit to Barista: Regular height toilet;Grab bars Equipment Used: Rolling walker;Gait belt Transfers/Ambulation Related to ADLs: Pt Min guard A with RW ambulation throughout room  Cognition: Cognition Overall Cognitive Status: Appears  within functional limits for tasks assessed Arousal/Alertness: Awake/alert Orientation Level: Oriented X4 Cognition Overall Cognitive Status: Appears within functional limits for tasks assessed/performed Arousal/Alertness: Awake/alert Orientation Level: Appears intact for tasks assessed Behavior During Session: [facility] for  tasks performed  Blood pressure 139/89, pulse 60, temperature 98.4 F (36.9 C), temperature source Oral, resp. rate 18, height 5\' 9"  (1.753 m), weight 51.483 kg (113 lb 8 oz), SpO2 99.00%. Physical Exam  Vitals reviewed. Constitutional: He is oriented to person, place, and time.  HENT:  Head: Normocephalic.  Neck: Neck supple. No thyromegaly present.  Cardiovascular: Regular rhythm.   Pulmonary/Chest: Breath sounds normal. He has no wheezes.  Abdominal: He exhibits no distension. There is no tenderness.  Musculoskeletal: He exhibits no edema.  Neurological: He is alert and oriented to person, place, and time.       Speech is dysarthric but intelligible. He follows three-step commands  Skin: Skin is warm and dry.  Psychiatric: He has a normal mood and affect.  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  Results for orders placed during the hospital encounter of 03/20/12 (from the past 24 hour(s))  CARDIAC PANEL(CRET KIN+CKTOT+MB+TROPI)     Status: Normal   Collection Time   03/21/12  1:36 PM      Component Value Range   Total CK 178  7 - 232 (U/L)   CK, MB 2.5  0.3 - 4.0 (ng/mL)   Troponin I <0.30  <0.30 (ng/mL)   Relative Index 1.4  0.0 - 2.5   CARDIAC PANEL(CRET KIN+CKTOT+MB+TROPI)     Status: Normal   Collection Time   03/21/12  8:00 PM      Component Value Range   Total CK 131  7 - 232 (U/L)   CK,  MB 2.1  0.3 - 4.0 (ng/mL)   Troponin I <0.30  <0.30 (ng/mL)   Relative Index 1.6  0.0 - 2.5   GLUCOSE, CAPILLARY     Status: Normal   Collection Time   03/21/12  9:32 PM      Component Value Range   Glucose-Capillary 93  70 - 99 (mg/dL)  CARDIAC PANEL(CRET KIN+CKTOT+MB+TROPI)     Status: Normal   Collection Time   03/22/12  2:11 AM      Component Value Range   Total CK 127  7 - 232 (U/L)   CK, MB 2.0  0.3 - 4.0 (ng/mL)   Troponin I <0.30  <0.30 (ng/mL)   Relative Index 1.6  0.0 - 2.5   BASIC METABOLIC PANEL     Status: Abnormal   Collection Time   03/22/12  2:11 AM      Component Value Range   Sodium 141  135 - 145 (mEq/L)   Potassium 3.7  3.5 - 5.1 (mEq/L)   Chloride 108  96 - 112 (mEq/L)   CO2 24  19 - 32 (mEq/L)   Glucose, Bld 98  70 - 99 (mg/dL)   BUN 15  6 - 23 (mg/dL)   Creatinine, Ser 2.95  0.50 - 1.35 (mg/dL)   Calcium 8.0 (*) 8.4 - 10.5 (mg/dL)   GFR calc non Af Amer 86 (*) >90 (mL/min)   GFR calc Af Amer >90  >90 (mL/min)  CBC     Status: Abnormal   Collection Time   03/22/12  2:11 AM      Component Value Range   WBC 2.7 (*) 4.0 - 10.5 (K/uL)   RBC 4.05 (*) 4.22 - 5.81 (MIL/uL)   Hemoglobin  12.1 (*) 13.0 - 17.0 (g/dL)   HCT 16.1 (*) 09.6 - 52.0 (%)   MCV 87.7  78.0 - 100.0 (fL)   MCH 29.9  26.0 - 34.0 (pg)   MCHC 34.1  30.0 - 36.0 (g/dL)   RDW 04.5  40.9 - 81.1 (%)   Platelets 155  150 - 400 (K/uL)  LIPID PANEL     Status: Normal   Collection Time   03/22/12  2:11 AM      Component Value Range   Cholesterol 140  0 - 200 (mg/dL)   Triglycerides 92  <914 (mg/dL)   HDL 46  >78 (mg/dL)   Total CHOL/HDL Ratio 3.0     VLDL 18  0 - 40 (mg/dL)   LDL Cholesterol 76  0 - 99 (mg/dL)  GLUCOSE, CAPILLARY     Status: Normal   Collection Time   03/22/12  7:29 AM      Component Value Range   Glucose-Capillary 96  70 - 99 (mg/dL)  CARDIAC PANEL(CRET KIN+CKTOT+MB+TROPI)     Status: Normal   Collection Time   03/22/12  9:08 AM      Component Value Range   Total CK 125  7  - 232 (U/L)   CK, MB 1.7  0.3 - 4.0 (ng/mL)   Troponin I <0.30  <0.30 (ng/mL)   Relative Index 1.4  0.0 - 2.5   GLUCOSE, CAPILLARY     Status: Normal   Collection Time   03/22/12 11:33 AM      Component Value Range   Glucose-Capillary 81  70 - 99 (mg/dL)   Dg Chest 2 View  11/22/5619  *RADIOLOGY REPORT*  Clinical Data: Stroke.  Smoker.  No chest complaints.  CHEST - 2 VIEW  Comparison: 08/01/2009 radiographs.  Findings: Narrow AP diameter of the chest. The heart size and mediastinal contours are normal. The lungs are clear. There is no pleural effusion or pneumothorax. No acute osseous findings are identified.  Prominent nipple shadows are noted bilaterally.  IMPRESSION: No active cardiopulmonary process.  Original Report Authenticated By: Gerrianne Scale, M.D.   Ct Head Wo Contrast  03/20/2012  *RADIOLOGY REPORT*  Clinical Data: Left arm weakness  CT HEAD WITHOUT CONTRAST  Technique:  Contiguous axial images were obtained from the base of the skull through the vertex without contrast.  Comparison: 08/03/2009 MRI  Findings: Focus of hypoattenuation within the high right frontal lobe, corresponds to the location of a focus of restricted diffusion on the 2010 MRI.  There is no evidence for acute hemorrhage, overt hydrocephalus, mass lesion, or abnormal extra-axial fluid collection.  No definite CT evidence for acute cortical based (large artery) infarction. The The visualized paranasal sinuses and mastoid air cells are predominately clear.  No displaced calvarial fracture.  IMPRESSION: Focus of hypoattenuation within the high right frontal lobe, corresponds to the location of restricted diffusion on the 2010 MRI. Therefore, may represent sequelae of prior infection or infarction.  However, if there is persistent concern for an acute intracranial process, MRI follow-up is recommend.  Original Report Authenticated By: Waneta Martins, M.D.   Mr Brain Wo Contrast  03/21/2012  *RADIOLOGY REPORT*   Clinical Data:  Left facial droop and left-sided weakness of unknown duration. HIV positive.  Hypertension.  Positive drug screen for cocaine.  MRI HEAD WITHOUT CONTRAST MRA HEAD WITHOUT CONTRAST  Technique:  Multiplanar, multiecho pulse sequences of the brain and surrounding structures were obtained without intravenous contrast. Angiographic images of the head were  obtained using MRA technique without contrast.  Comparison:  CT head 03/20/2012.  MRI head 08/03/2009.  MRI HEAD  Findings:  There is an acute 11 x 26 mm region of acute infarction affecting the right posterior frontal cortex near the central sulcus.  There is no hemorrhage, mass lesion, hydrocephalus, or extra-axial fluid.  There is slight premature atrophy. Scattered foci of prolonged T2 signal and the subcortical and periventricular white matter signal are nonspecific, possibly representing chronic microvascular ischemic change due hypertension, chronic infection, or idiopathic. No midline shift.  Major intracranial vascular structures are patent.  Skull base and calvarium negative.  Unremarkable pituitary and cerebellar tonsils.  Contrast was not administered.  Compared with 2010, this infarct is slightly more posterior and correlates with the observed cytotoxic edema demonstrated on yesterday's CT.  IMPRESSION: Findings consistent with acute right posterior frontal cortical infarction.  No hemorrhage.  MRA HEAD  Findings: There are no focal lesions of the skull base, cavernous, or supraclinoid internal carotid arteries.  No visible evidence for dissection or pseudoaneurysm.  The basilar arteries widely patent.  Vertebrals are codominant.  There is no proximal stenosis of the anterior, middle, or posterior cerebral arteries.  No cerebellar branch occlusion is observed.  On submentovertex projections of the anterior circulation, there is a suggestion of diminished caliber or possibly absence of a right M3 or M4 MCA vessel subserving the area of acute  infarction.  It is unclear if this represents spasm or occlusion.  Formal catheter angiogram could be helpful in further evaluation.  IMPRESSION: No proximal stenosis, dissection or occlusion. Suspect diminished or absent flow related enhancement right posterior frontal MCA branch.  Original Report Authenticated By: Elsie Stain, M.D.   Mr Mra Head/brain Wo Cm  03/21/2012  *RADIOLOGY REPORT*  Clinical Data:  Left facial droop and left-sided weakness of unknown duration. HIV positive.  Hypertension.  Positive drug screen for cocaine.  MRI HEAD WITHOUT CONTRAST MRA HEAD WITHOUT CONTRAST  Technique:  Multiplanar, multiecho pulse sequences of the brain and surrounding structures were obtained without intravenous contrast. Angiographic images of the head were obtained using MRA technique without contrast.  Comparison:  CT head 03/20/2012.  MRI head 08/03/2009.  MRI HEAD  Findings:  There is an acute 11 x 26 mm region of acute infarction affecting the right posterior frontal cortex near the central sulcus.  There is no hemorrhage, mass lesion, hydrocephalus, or extra-axial fluid.  There is slight premature atrophy. Scattered foci of prolonged T2 signal and the subcortical and periventricular white matter signal are nonspecific, possibly representing chronic microvascular ischemic change due hypertension, chronic infection, or idiopathic. No midline shift.  Major intracranial vascular structures are patent.  Skull base and calvarium negative.  Unremarkable pituitary and cerebellar tonsils.  Contrast was not administered.  Compared with 2010, this infarct is slightly more posterior and correlates with the observed cytotoxic edema demonstrated on yesterday's CT.  IMPRESSION: Findings consistent with acute right posterior frontal cortical infarction.  No hemorrhage.  MRA HEAD  Findings: There are no focal lesions of the skull base, cavernous, or supraclinoid internal carotid arteries.  No visible evidence for dissection  or pseudoaneurysm.  The basilar arteries widely patent.  Vertebrals are codominant.  There is no proximal stenosis of the anterior, middle, or posterior cerebral arteries.  No cerebellar branch occlusion is observed.  On submentovertex projections of the anterior circulation, there is a suggestion of diminished caliber or possibly absence of a right M3 or M4 MCA vessel subserving the area  of acute infarction.  It is unclear if this represents spasm or occlusion.  Formal catheter angiogram could be helpful in further evaluation.  IMPRESSION: No proximal stenosis, dissection or occlusion. Suspect diminished or absent flow related enhancement right posterior frontal MCA branch.  Original Report Authenticated By: Elsie Stain, M.D.    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 Does the need for close, 24 hr/day medical supervision in concert with the patient's rehab needs make it unreasonable for this patient to be served in a less intensive setting? No 2. Co-Morbidities requiring supervision/potential complications: HIV, cocaine abuse 3. Due to safety and patient education, does the patient require 24 hr/day rehab nursing? No 4. Does the patient require coordinated care of a physician, rehab nurse, PT (0.5-1 hrs/day, 3 days/week), OT (0.5-1 hrs/day, 3 days/week) and SLP (0.5-1 hrs/day, 3 days/week) to address physical and functional deficits in the context of the above medical diagnosis(es)? No Addressing deficits in the following areas: balance, endurance, locomotion, strength, transferring, bathing, dressing and toileting 5. Can the patient actively participate in an intensive therapy program of at least 3 hrs of therapy per day at least 5 days per week? Yes 6. The potential for patient to make measurable gains while on inpatient rehab is not applicable 7. Anticipated functional outcomes upon discharge from inpatient rehab  are not applicable with PT, not applicable with OT, not applicable with SLP. 8. Estimated rehab length of stay to reach the above functional goals is: not applicable 9. Does the patient have adequate social supports to accommodate these discharge functional goals? Potentially 10. Anticipated D/C setting: Home 11. Anticipated post D/C treatments: Outpt therapy 12. Overall Rehab/Functional Prognosis: good  RECOMMENDATIONS: This patient's condition is appropriate for continued rehabilitative care in the following setting: Outpt Patient has agreed to participate in recommended program. Potentially Note that insurance prior authorization may be required for reimbursement for recommended care.  Comment:too high level for CIR   03/22/2012

## 2012-03-23 DIAGNOSIS — B20 Human immunodeficiency virus [HIV] disease: Secondary | ICD-10-CM

## 2012-03-23 DIAGNOSIS — G459 Transient cerebral ischemic attack, unspecified: Secondary | ICD-10-CM

## 2012-03-23 DIAGNOSIS — F1910 Other psychoactive substance abuse, uncomplicated: Secondary | ICD-10-CM

## 2012-03-23 LAB — CBC
HCT: 37.7 % — ABNORMAL LOW (ref 39.0–52.0)
Hemoglobin: 12.7 g/dL — ABNORMAL LOW (ref 13.0–17.0)
MCHC: 33.7 g/dL (ref 30.0–36.0)
MCV: 88.1 fL (ref 78.0–100.0)
RDW: 14 % (ref 11.5–15.5)

## 2012-03-23 LAB — BASIC METABOLIC PANEL
BUN: 10 mg/dL (ref 6–23)
CO2: 24 mEq/L (ref 19–32)
Chloride: 108 mEq/L (ref 96–112)
Creatinine, Ser: 0.89 mg/dL (ref 0.50–1.35)
Glucose, Bld: 91 mg/dL (ref 70–99)

## 2012-03-23 LAB — DIFFERENTIAL
Basophils Absolute: 0 10*3/uL (ref 0.0–0.1)
Basophils Relative: 1 % (ref 0–1)
Eosinophils Absolute: 0.5 10*3/uL (ref 0.0–0.7)
Lymphs Abs: 1.2 10*3/uL (ref 0.7–4.0)
Neutro Abs: 0.8 10*3/uL — ABNORMAL LOW (ref 1.7–7.7)

## 2012-03-23 LAB — HEMOGLOBIN A1C: Mean Plasma Glucose: 85 mg/dL (ref <117)

## 2012-03-23 MED ORDER — POTASSIUM CHLORIDE CRYS ER 20 MEQ PO TBCR
40.0000 meq | EXTENDED_RELEASE_TABLET | Freq: Once | ORAL | Status: AC
  Administered 2012-03-23: 40 meq via ORAL
  Filled 2012-03-23: qty 2

## 2012-03-23 NOTE — Progress Notes (Signed)
Stroke Team Progress Note  HISTORY  Eric Gardner is an 51 y.o. male developed LUE and left facial numbness around 20:00 03/19/12. Woke in am unable to move left arm, unable to chew due to numbness, and slurred speech. He did not come to the ER initially because he thought he'd get better. 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 Risk factors include tobacco use, cocaine use, HTN and HIV.    SUBJECTIVE Left sided numbness still present, no change. Sitting up in bed answering questions appropriately, eating lunch Overall he feels his condition is gradually improving. He admits to polysubstance abuse including smoking. He vows that he is not going to smoke after this admission. He wants to change his living arrangements to [facility] as his siblings live here so that he can participate in outpatient rehab therapies here.   Past Medical History   Diagnosis  Date   .  Hypertension    .  HIV (human immunodeficiency virus infection)      Social History: Smokes. He does not have any smokeless tobacco history on file. Cocaine use  Allergies: No Known Allergies  OBJECTIVE Most recent Vital Signs: Filed Vitals:   03/22/12 1730 03/22/12 2221 03/23/12 0127 03/23/12 0612  BP: 148/86 136/81 132/84 137/87  Pulse: 64 61 72 73  Temp: 97.8 F (36.6 C) 97.9 F (36.6 C) 97.5 F (36.4 C) 97.7 F (36.5 C)  TempSrc: Oral Oral Oral Oral  Resp: 18 18 19 18   Height:      Weight:      SpO2: 100% 99% 97% 98%   CBG (last 3)   Basename 03/22/12 1133 03/22/12 0729 03/21/12 2132  GLUCAP 81 96 93   Intake/Output from previous day: 06/10 0701 - 06/11 0700 In: 1665 [P.O.:1665] Out: 1450 [Urine:1450]  IV Fluid Intake:      . DISCONTD: sodium chloride 75 mL/hr at 03/22/12 0321  . DISCONTD: sodium chloride 75 mL/hr at 03/21/12 1720    MEDICATIONS     . clopidogrel  75 mg Oral Q breakfast  . efavirenz-emtrictabine-tenofovir  1 tablet Oral QHS  . lactose free  nutrition  237 mL Oral TID WC  . white petrolatum      . DISCONTD: aspirin  300 mg Rectal Daily  . DISCONTD: aspirin  325 mg Oral Daily   PRN:  acetaminophen, acetaminophen, ondansetron (ZOFRAN) IV, senna-docusate  Diet:   dysphagia diet Activity:  Up with physicial therapy DVT Prophylaxis:  SCD.  CLINICALLY SIGNIFICANT STUDIES Basic Metabolic Panel:   Lab 03/23/12 0229 03/22/12 0211  NA 141 141  K 3.4* 3.7  CL 108 108  CO2 24 24  GLUCOSE 91 98  BUN 10 15  CREATININE 0.89 1.00  CALCIUM 8.3* 8.0*  MG -- --  PHOS -- --   Liver Function Tests:   Lab 03/20/12 2107  AST 20  ALT 20  ALKPHOS 84  BILITOT 0.3  PROT 7.7  ALBUMIN 4.4   CBC:   Lab 03/23/12 0229 03/22/12 0211 03/20/12 2107  WBC 2.7* 2.7* --  NEUTROABS 0.8* -- 1.1*  HGB 12.7* 12.1* --  HCT 37.7* 35.5* --  MCV 88.1 87.7 --  PLT 179 155 --   Coagulation:   Lab 03/20/12 2107  LABPROT 12.7  INR 0.93   Cardiac Enzymes:   Lab 03/22/12 1948 03/22/12 1404 03/22/12 0908  CKTOTAL 121 156 125  CKMB 2.0 1.7 1.7  CKMBINDEX -- -- --  TROPONINI <0.30 <0.30 <0.30  Lipid Panel    Component Value Date/Time   CHOL 140 03/22/2012 0211   TRIG 92 03/22/2012 0211   HDL 46 03/22/2012 0211   CHOLHDL 3.0 03/22/2012 0211   VLDL 18 03/22/2012 0211   LDLCALC 76 03/22/2012 0211   HgbA1C  No results found for this basename: HGBA1C    Urine Drug Screen:     Component Value Date/Time   LABOPIA NONE DETECTED 03/20/2012 2133   COCAINSCRNUR POSITIVE* 03/20/2012 2133   LABBENZ NONE DETECTED 03/20/2012 2133   AMPHETMU NONE DETECTED 03/20/2012 2133   THCU NONE DETECTED 03/20/2012 2133   LABBARB NONE DETECTED 03/20/2012 2133    CT of the brain  Focus of hypoattenuation within the high right frontal lobe, corresponds to the location of restricted diffusion on the 2010 MRI. Therefore, may represent sequelae of prior infection or infarction. However, if there is persistent concern for an acute intracranial process,  MRI of the brain   Findings consistent with acute right posterior frontal cortical infarction. No hemorrhage.   MRA of the brain No proximal stenosis, dissection or occlusion. Suspect diminished or absent flow related enhancement right posterior frontal MCA branch.   2D Echocardiogram  EF 40-45, Systemic veins: Microbubbles noted during IVC imaging. No cardiac source of embolism was identified  Carotid Doppler  No ica stenosis bilaterally  CXR  No active cardiopulmonary process.   EKG Sinus Bradycardia.   Patient at too high level for acute inpatient rehab, rec: outpatient therapies  Physical Exam young African American male not in distress. Looks malnourished and cachectic. Awake alert. Afebrile. Head is nontraumatic. Neck is supple without bruit. Hearing is normal. Cardiac exam no murmur or gallop. Lungs are clear to auscultation. Distal pulses are well felt.  Neurological Exam : Awake alert oriented x 3 normal speech and language .normal visual acuity and fields. Fundi not visualized.. Mild left lower face asymmetry. Tongue midline. No drift. Mild diminished fine finger movements on left. Orbits right over left upper extremity. Mild left grip weak.. Normal sensation . Normal coordination. Gait not tested  ASSESSMENT  Mr. Eric Gardner is a 51 y.o. male with a right posterior frontal infarct etiology unknown. Multiple risks factors including polysubstance abuse including vasculitis associated with HIV, small vessel disease, embolic stroke, or vasospasm that can be seen with cocaine use, this episode felt secondary to microvascular disease .   On Asa 325mg  daily at home prior to admission. Will need to change to plavix due to aspirin failure as outpatient  Hospital day # 3  TREATMENT/PLAN -Continue clopidogrel 75 mg orally every day for secondary stroke prevention. -Counsel re: cessation of polysubstance abuse including cocaine, smoking. -recommend outpatient therapy in [place] while living with  siblings. -Stroke Service will sign off. Follow up with Dr. Pearlean Brownie in 2 months.   Guy Franco, [facility],  MBA, MHA [facility] Pager: [phone] 03/23/2012 11:45 AM  Scribe for Dr. Delia Heady, Stroke Center Medical Director. He has personally reviewed chart, pertinent data, examined the patient and developed the plan of care. Pager:  [phone]

## 2012-03-23 NOTE — Progress Notes (Signed)
Speech Language Pathology Treatment Patient Details Name: Eric Gardner MRN: [id number] DOB: 1961/04/11 Today's Date: 03/23/2012 Time: 4098-1191 SLP Time Calculation (min): 8 min  Assessment / Plan / Recommendation Clinical Impression  see skilled treatment section    SLP Plan  Continue with current plan of care       SLP Goals  SLP Goals SLP Goal #1 - Progress: Progressing toward goal SLP Goal #2 - Progress: Progressing toward goal  General Temperature Spikes Noted: No Respiratory Status: Room air Behavior/Cognition: Alert;Cooperative Oral Cavity - Dentition: Dentures, top Patient Positioning: Upright in bed  Oral Cavity - Oral Hygiene Does patient have any of the following "at risk" factors?: None of the above Brush patient's teeth BID with toothbrush (using toothpaste with fluoride): Yes   Treatment Treatment focused on: Dysarthria Skilled Treatment: Pt. seen for dysarthria treatment.  Inroduced speech strategies and provided examples.  Pt utilized strategies in conversation with min-mod assist with emphasis on over articulation and pausing between words.  Continue ST.  May be transferring in inpatient rehab.   Royce Macadamia 03/23/2012, 10:11 AM

## 2012-03-23 NOTE — Progress Notes (Signed)
Speech Language Pathology Dysphagia Treatment Patient Details Name: Eric Gardner MRN: [id number] DOB: October 06, 1961 Today's Date: 03/23/2012 Time: 4098-1191 SLP Time Calculation (min): 8 min  Assessment / Plan / Recommendation Clinical Impression  Pt. seen for ability to advance to regular texture and safety with thin liquids.  Mildly slower oral prep and transit with a deliberate and slightly effortful mastication pattern.  Pt. took sip water via straw after swallowing cracker with immediate multiple and hard coughs.  Suspect liquid prematurely entered his vestibule prior to swallow.  No other episodes of coughing exhibited with small sips thin via straw.  Recommend he continue on a Dys 3 texture which he is in agreement and thin liquids.    Diet Recommendation  Continue with Current Diet: Dysphagia 3 (mechanical soft);Thin liquid    SLP Plan Continue with current plan of care      Swallowing Goals  SLP Swallowing Goals Swallow Study Goal #1 - Progress: Progressing toward goal Swallow Study Goal #2 - Progress: Progressing toward goal  General Temperature Spikes Noted: No Respiratory Status: Room air Behavior/Cognition: Alert;Cooperative;Pleasant mood Oral Cavity - Dentition: Dentures, top Patient Positioning: Upright in bed  Oral Cavity - Oral Hygiene Does patient have any of the following "at risk" factors?: None of the above Brush patient's teeth BID with toothbrush (using toothpaste with fluoride): Yes   Dysphagia Treatment Treatment focused on: Skilled observation of diet tolerance;Patient/family/caregiver education;Facilitation of oral preparatory phase;Facilitation of pharyngeal phase;Facilitation of oral phase;Utilization of compensatory strategies Treatment Methods/Modalities: Skilled observation Patient observed directly with PO's: Yes Type of PO's observed: Regular;Thin liquids Feeding: Able to feed self Liquids provided via: Straw Pharyngeal Phase Signs & Symptoms:  Immediate cough Type of cueing: Verbal Amount of cueing: Minimal   Royce Macadamia 03/23/2012, 10:01 AM

## 2012-03-23 NOTE — Care Management Note (Signed)
    Page 1 of 1   03/29/2012     8:09:04 AM   CARE MANAGEMENT NOTE 03/29/2012  Patient:  Eric Gardner, Eric Gardner   Account Number:  [id number]  Date Initiated:  03/22/2012  Documentation initiated by:  [facility]  Subjective/Objective Assessment:   Admitted with CVA.Lives in [place] with Gardner room mate.     Action/Plan:   PT eval-recommending inpatient rehab  OT eval- recommeding inpatient rehab  ST eval- recommending inpatient rehab  SNF consult for substance abuse   Anticipated DC Date:  03/28/2012   Anticipated DC Plan:  SKILLED NURSING FACILITY  In-house referral  Clinical Social Worker      DC Planning Services  CM consult      Choice offered to / List presented to:             Status of service:  Completed, signed off Medicare Important Message given?   (If response is "NO", the following Medicare IM given date fields will be blank) Date Medicare IM given:   Date Additional Medicare IM given:    Discharge Disposition:  SKILLED NURSING FACILITY  Per UR Regulation:  Reviewed for med. necessity/level of care/duration of stay  If discussed at Long Length of Stay Meetings, dates discussed:    Comments:  03/23/12 Patient evaluated for inpatient rehab but did not qualify for inpatient rehab level. PT/OT recommending SNF level. Spoke with patient, he is agreeable to SNF in [facility] . Made referral to CSW. Will continue to follow for d/c needs. Jacquelynn Cree RN, BSN, CCM

## 2012-03-23 NOTE — Progress Notes (Signed)
Occupational Therapy Treatment Patient Details Name: Eric Gardner MRN: [id number] DOB: March 22, 1961 Today's Date: 03/23/2012 Time: 4098-1191 OT Time Calculation (min): 18 min  OT Assessment / Plan / Recommendation Comments on Treatment Session Eric Gardner progressing very well; however, continues with balance deficits which put Eric Gardner at high risk of falling at home. Although Eric Gardner would prefer to return home, he is willing to go to SNF for a while as he recognizes he is unsafe to return home immediately from hospital    Follow Up Recommendations  Skilled nursing facility    Barriers to Discharge       Equipment Recommendations  Defer to next venue    Recommendations for Other Services    Frequency     Plan Discharge plan needs to be updated    Precautions / Restrictions Precautions Precautions: Fall Restrictions Weight Bearing Restrictions: No   Pertinent Vitals/Pain Eric Gardner denies any pain at this time    ADL  Upper Body Bathing: Simulated;Minimal assistance Where Assessed - Upper Body Bathing: Unsupported standing Lower Body Bathing: Simulated;Minimal assistance Where Assessed - Lower Body Bathing: Unsupported standing Tub/Shower Transfer: Performed;Minimal assistance Tub/Shower Transfer Method: Ambulating Equipment Used: Gait belt Transfers/Ambulation Related to ADLs: Eric Gardner Min guard A with ambulation to/from gym with near LOB x 3, but Eric Gardner able to self-correct. ADL Comments: Eric Gardner educated how to side-step into/out of tub to increase UE support and safety. Eric Gardner required Min A secondary to LOB during this.     OT Diagnosis:    OT Problem List:   OT Treatment Interventions:     OT Goals ADL Goals ADL Goal: Lower Body Bathing - Progress: Progressing toward goals ADL Goal: Lower Body Dressing - Progress: Progressing toward goals ADL Goal: Toilet Transfer - Progress: Progressing toward goals ADL Goal: Tub/Shower Transfer - Progress: Progressing toward goals ADL Goal: Additional Goal #1 -  Progress: Met Additional ADL Goal #2: Eric Gardner will be I with vision HEP. ADL Goal: Additional Goal #2 - Progress: Goal set today  Visit Information  Last OT Received On: 03/23/12 Assistance Needed: +1    Subjective Data      Prior Functioning       Cognition  Overall Cognitive Status: Appears within functional limits for tasks assessed/performed Arousal/Alertness: Awake/alert Orientation Level: Appears intact for tasks assessed Behavior During Session: [facility] for tasks performed    Mobility Bed Mobility Supine to Sit: 6: Modified independent (Device/Increase time) Sitting - Scoot to Edge of Bed: 6: Modified independent (Device/Increase time) Sit to Supine: 6: Modified independent (Device/Increase time) Transfers Sit to Stand: 5: Supervision (Eric Gardner initially dizzy upon sit to stand; cleared after 3-5sec) Stand to Sit: 6: Modified independent (Device/Increase time)   Exercises    Balance Dynamic Standing Balance Dynamic Standing - Level of Assistance: 4: Min assist Dynamic Standing - Balance Activities: Forward lean/weight shifting;Reaching for objects (functional activity (simulated showering)) Dynamic Standing - Comments: Eric Gardner Min A for dynamic standing balance in tub to simulate showering. Eric Gardner with posterior and lateral LOB's with reaching and eyes closed.   End of Session OT - End of Session Equipment Utilized During Treatment: Gait belt Activity Tolerance: Patient tolerated treatment well Patient left: in bed;with bed alarm set;with call bell/phone within reach   Kirat Mezquita 03/23/2012, 3:16 PM

## 2012-03-23 NOTE — Clinical Social Work Note (Signed)
CSW spoke with PT who shared pt will need SNF at discharge. CSW met pt to address discharge plan. CSW explained SNF placement procress. Pt is agreeable to SNF. CSW will initiate SNF search and follow up with bed offers. CSW will continue to follow.   Dede Query, MSW, Theresia Majors [phone]

## 2012-03-23 NOTE — Progress Notes (Signed)
Physical Therapy Treatment Patient Details Name: Eric Gardner MRN: [id number] DOB: [birth year] Today's Date: 03/23/2012 Time: 4098-1191 PT Time Calculation (min): 22 min  PT Assessment / Plan / Recommendation Comments on Treatment Session  Patient very motivated to regain independence however limited today by dizziness. Patient is progressing too well for CIR however will need rehab in a STSNF due to patient not having assistance at home and he is a high fall risk.     Follow Up Recommendations  Skilled nursing facility    Barriers to Discharge        Equipment Recommendations  None recommended by PT;None recommended by OT    Recommendations for Other Services    Frequency Min 4X/week   Plan Discharge plan remains appropriate;Frequency remains appropriate    Precautions / Restrictions Precautions Precautions: Fall   Pertinent Vitals/Pain     Mobility  Bed Mobility Supine to Sit: 6: Modified independent (Device/Increase time) Sitting - Scoot to Edge of Bed: 6: Modified independent (Device/Increase time) Sit to Supine: 6: Modified independent (Device/Increase time) Transfers Sit to Stand: 6: Modified independent (Device/Increase time) Stand to Sit: 6: Modified independent (Device/Increase time) Ambulation/Gait Ambulation Distance (Feet): 400 Feet Assistive device: None Ambulation/Gait Assistance Details: Patient becoming dizzy with ambulation. All VSS. Patient stated that its more because of his blurred vision. Once ambulation is stop the dizziness subsides. Patient continues to be unsteady with gait needing cues for safety and A to regain balance at times especially with dynamic gait task Gait Pattern: Decreased stride length Modified Rankin (Stroke Patients Only) Pre-Morbid Rankin Score: No symptoms Modified Rankin: Moderately severe disability    Exercises     PT Diagnosis:    PT Problem List:   PT Treatment Interventions:     PT Goals Acute Rehab PT Goals PT  Goal: Supine/Side to Sit - Progress: Met PT Goal: Sit to Supine/Side - Progress: Met PT Goal: Sit to Stand - Progress: Met PT Goal: Stand to Sit - Progress: Met PT Goal: Ambulate - Progress: Progressing toward goal Additional Goals PT Goal: Additional Goal #1 - Progress: Progressing toward goal  Visit Information  Last PT Received On: 03/23/12 Assistance Needed: +1    Subjective Data  Subjective: I know I need to get more rehab   Cognition  Overall Cognitive Status: Appears within functional limits for tasks assessed/performed Arousal/Alertness: Awake/alert Orientation Level: Appears intact for tasks assessed Behavior During Session: [facility] for tasks performed    Balance     End of Session PT - End of Session Equipment Utilized During Treatment: Gait belt Activity Tolerance: Patient limited by fatigue;Treatment limited secondary to medical complications (Comment) (dizziness) Patient left: in bed Nurse Communication: Mobility status    Fredrich Birks 03/23/2012, 1:18 PM 03/23/2012 Fredrich Birks PTA [phone] pager [phone] office

## 2012-03-23 NOTE — Progress Notes (Signed)
Subjective: Patient states some improvement with weakness.no change in his facial weakness.  Objective: Vital signs in last 24 hours: Filed Vitals:   03/23/12 0127 03/23/12 0612 03/23/12 1007 03/23/12 1800  BP: 132/84 137/87 137/83 96/57  Pulse: 72 73 63 59  Temp: 97.5 F (36.4 C) 97.7 F (36.5 C) 97.3 F (36.3 C) 97.9 F (36.6 C)  TempSrc: Oral Oral  Oral  Resp: 19 18 20 18   Height:      Weight:      SpO2: 97% 98% 98% 98%    Intake/Output Summary (Last 24 hours) at 03/23/12 2018 Last data filed at 03/23/12 1803  Gross per 24 hour  Intake   1090 ml  Output   1400 ml  Net   -310 ml    Weight change:   General: Alert, awake, oriented x3, in no acute distress. HEENT: No bruits, no goiter. Heart: Regular rate and rhythm, without murmurs, rubs, gallops. Lungs: Clear to auscultation bilaterally. Abdomen: Soft, nontender, nondistended, positive bowel sounds. Extremities: No clubbing cyanosis or edema with positive pedal pulses. Neuro: L facial droop.   Lab Results:  [facility] 03/23/12 0229 03/22/12 0211  NA 141 141  K 3.4* 3.7  CL 108 108  CO2 24 24  GLUCOSE 91 98  BUN 10 15  CREATININE 0.89 1.00  CALCIUM 8.3* 8.0*  MG -- --  PHOS -- --    Basename 03/20/12 2107  AST 20  ALT 20  ALKPHOS 84  BILITOT 0.3  PROT 7.7  ALBUMIN 4.4   No results found for this basename: LIPASE:2,AMYLASE:2 in the last 72 hours  Basename 03/23/12 0229 03/22/12 0211 03/20/12 2107  WBC 2.7* 2.7* --  NEUTROABS 0.8* -- 1.1*  HGB 12.7* 12.1* --  HCT 37.7* 35.5* --  MCV 88.1 87.7 --  PLT 179 155 --    Basename 03/22/12 1948 03/22/12 1404 03/22/12 0908  CKTOTAL 121 156 125  CKMB 2.0 1.7 1.7  CKMBINDEX -- -- --  TROPONINI <0.30 <0.30 <0.30   No components found with this basename: POCBNP:3 No results found for this basename: DDIMER:2 in the last 72 hours  Basename 03/22/12 0211  HGBA1C 4.6    Basename 03/22/12 0211  CHOL 140  HDL 46  LDLCALC 76  TRIG 92  CHOLHDL 3.0    LDLDIRECT --   No results found for this basename: TSH,T4TOTAL,FREET3,T3FREE,THYROIDAB in the last 72 hours No results found for this basename: VITAMINB12:2,FOLATE:2,FERRITIN:2,TIBC:2,IRON:2,RETICCTPCT:2 in the last 72 hours  Micro Results: No results found for this or any previous visit (from the past 240 hour(s)).  Studies/Results: No results found.  Medications:     . clopidogrel  75 mg Oral Q breakfast  . efavirenz-emtrictabine-tenofovir  1 tablet Oral QHS  . lactose free nutrition  237 mL Oral TID WC  . potassium chloride  40 mEq Oral Once  . white petrolatum        Assessment: Principal Problem:  *CVA (cerebral infarction) Active Problems:  HIV DISEASE   Plan: #1 acute right posterior frontal cortical infarct Per MRI. 2-D echo with EF of 45-50%. With diffuse hypokinesis. No cardiac source of emboli noted. Carotid Dopplers with no significant ICA stenosis.. The importance of tobacco cessation and polysubstance abuse with counseled to the patient. Aspirin has been changed to Plavix. PT OT. Follow. Neurology following and appreciate input and recommendations.  #2 HIV Stable. Continue Atripla. Follow.  #3 Dispo Likely needs meds. Awaiting placement.   LOS: 3 days   Tawn Fitzner [phone] 03/23/2012,  8:18 PM

## 2012-03-24 DIAGNOSIS — G459 Transient cerebral ischemic attack, unspecified: Secondary | ICD-10-CM

## 2012-03-24 DIAGNOSIS — B20 Human immunodeficiency virus [HIV] disease: Secondary | ICD-10-CM

## 2012-03-24 DIAGNOSIS — F1910 Other psychoactive substance abuse, uncomplicated: Secondary | ICD-10-CM

## 2012-03-24 LAB — BASIC METABOLIC PANEL
CO2: 24 mEq/L (ref 19–32)
Calcium: 8.5 mg/dL (ref 8.4–10.5)
Chloride: 108 mEq/L (ref 96–112)
Glucose, Bld: 97 mg/dL (ref 70–99)
Sodium: 141 mEq/L (ref 135–145)

## 2012-03-24 LAB — CBC
HCT: 39.4 % (ref 39.0–52.0)
MCH: 29.4 pg (ref 26.0–34.0)
MCV: 87.2 fL (ref 78.0–100.0)
Platelets: 183 10*3/uL (ref 150–400)
RBC: 4.52 MIL/uL (ref 4.22–5.81)
WBC: 2.8 10*3/uL — ABNORMAL LOW (ref 4.0–10.5)

## 2012-03-24 LAB — GLUCOSE, CAPILLARY: Glucose-Capillary: 83 mg/dL (ref 70–99)

## 2012-03-24 MED ORDER — CLOPIDOGREL BISULFATE 75 MG PO TABS: 75.0000 mg | ORAL_TABLET | Freq: Every day | ORAL | Status: AC

## 2012-03-24 NOTE — Progress Notes (Signed)
Physical Therapy Treatment Patient Details Name: Eric Gardner MRN: [id number] DOB: [birth year] Today's Date: 03/24/2012 Time: 5329-9242 PT Time Calculation (min): 32 min  PT Assessment / Plan / Recommendation Comments on Treatment Session  Patient able to work on dynamic balance today with ball tossing and BOS activities. Patient continues to require A to regain LOB. Continue to recommend SNF as patient unable to care for himself at home at this time and is at high risk for falling    Follow Up Recommendations  Skilled nursing facility    Barriers to Discharge        Equipment Recommendations  Defer to next venue    Recommendations for Other Services    Frequency Min 4X/week   Plan Frequency remains appropriate;Discharge plan remains appropriate    Precautions / Restrictions Precautions Precautions: Fall   Pertinent Vitals/Pain     Mobility  Bed Mobility Supine to Sit: 7: Independent Sitting - Scoot to Edge of Bed: 7: Independent Transfers Sit to Stand: 5: Supervision;From chair/3-in-1;From bed Stand to Sit: 6: Modified independent (Device/Increase time);To chair/3-in-1 Details for Transfer Assistance: Patient with slight unsteadiness today with stand due to dizziness. Dizziness subsides after a couple of seconds Ambulation/Gait Ambulation/Gait Assistance: 4: Min assist Ambulation Distance (Feet): 250 Feet Assistive device: None Ambulation/Gait Assistance Details: Patient ambulated forwards, backwards, and sideways to work on dynamic gait balance. Patient requring Min A for balance. Slight unsteadiness consistently with gait Gait Pattern: Lateral trunk lean to left;Lateral trunk lean to right;Decreased step length - right;Step-through pattern Gait velocity: Patient guarded    Exercises     PT Diagnosis:    PT Problem List:   PT Treatment Interventions:     PT Goals Acute Rehab PT Goals PT Goal: Supine/Side to Sit - Progress: Met PT Goal: Sit to Stand - Progress:  Met PT Goal: Stand to Sit - Progress: Met PT Goal: Ambulate - Progress: Progressing toward goal Additional Goals PT Goal: Additional Goal #1 - Progress: Progressing toward goal  Visit Information  Last PT Received On: 03/24/12 Assistance Needed: +1    Subjective Data      Cognition  Overall Cognitive Status: Appears within functional limits for tasks assessed/performed Arousal/Alertness: Awake/alert Orientation Level: Appears intact for tasks assessed Behavior During Session: [facility] for tasks performed    Balance  Dynamic Standing Balance Dynamic Standing - Balance Support: During functional activity;No upper extremity supported Dynamic Standing - Level of Assistance: 4: Min assist Dynamic Standing - Balance Activities: Lateral lean/weight shifting;Forward lean/weight shifting;Ball toss;Reaching for objects;Reaching across midline Dynamic Standing - Comments: Had patient change levels of BOS. Unable to step tandem and hold balance. Dynamic balance x15 minutes  End of Session PT - End of Session Equipment Utilized During Treatment: Gait belt Activity Tolerance: Patient tolerated treatment well Patient left: in chair Nurse Communication: Mobility status    Fredrich Birks 03/24/2012, 9:10 AM 03/24/2012 Fredrich Birks PTA [phone] pager [phone] office

## 2012-03-24 NOTE — Discharge Instructions (Signed)
STROKE/TIA DISCHARGE INSTRUCTIONS SMOKING Cigarette smoking nearly doubles your risk of having a stroke & is the single most alterable risk factor  If you smoke or have smoked in the last 12 months, you are advised to quit smoking for your health.  Most of the excess cardiovascular risk related to smoking disappears within a year of stopping.  Ask you doctor about anti-smoking medications  [place] Quit Line: 1-800-QUIT NOW  Free Smoking Cessation Classes [phone]  CHOLESTEROL Know your levels; limit fat & cholesterol in your diet  Lipid Panel   Cholesterol     Component Value Date/Time   CHOL 140 03/22/2012 0211   TRIG 92 03/22/2012 0211   HDL 46 03/22/2012 0211   CHOLHDL 3.0 03/22/2012 0211   VLDL 18 03/22/2012 0211   LDLCALC 76 03/22/2012 0211      Many patients benefit from treatment even if their cholesterol is at goal.  Goal: Total Cholesterol (CHOL) less than 160  Goal:  Triglycerides (TRIG) less than 150  Goal:  HDL greater than 40  Goal:  LDL (LDLCALC) less than 100   BLOOD PRESSURE American Stroke Association blood pressure target is less that 120/80 mm/Hg   BP: 117/74 mmHg  Monitor your blood pressure  Limit your salt and alcohol intake  Many individuals will require more than one medication for high blood pressure  DIABETES (A1c is a blood sugar average for last 3 months) Goal HGBA1c is under 7% (HBGA1c is blood sugar average for last 3 months)  Diabetes:  No results found for this basename: HGBA1C     Your HGBA1c can be lowered with medications, healthy diet, and exercise.  Check your blood sugar as directed by your physician  Call your physician if you experience unexplained or low blood sugars.  PHYSICAL ACTIVITY/REHABILITATION Goal is 30 minutes at least 4 days per week   Skilled nursing facility  Activity decreases your risk of heart attack and stroke and makes your heart stronger.  It helps control your weight and blood pressure; helps you relax and  can improve your mood.  Participate in a regular exercise program.  Talk with your doctor about the best form of exercise for you (dancing, walking, swimming, cycling).  DIET/WEIGHT Goal is to maintain a healthy weight   Dysphagia 3   Height: 5\' 9"  (175.3 cm)  Weight: 51.483 kg (113 lb 8 oz)  BMI (Calculated): 16.8   Following the type of diet specifically designed for you will help prevent another stroke.  Your goal Body Mass Index (BMI) is 19-24.  Healthy food habits can help reduce 3 risk factors for stroke:  High cholesterol, hypertension, and excess weight.  RESOURCES Stroke/Support Group:  Call [phone]  they meet the 3rd Sunday of the month on the Rehab Unit at [facility], [place] ( no meetings June, July & Aug).  STROKE EDUCATION PROVIDED/REVIEWED AND GIVEN TO PATIENT Stroke warning signs and symptoms How to activate emergency medical system (call 911). Medications prescribed at discharge. Need for follow-up after discharge. Personal risk factors for stroke. My questions have been answered, the writing is legible, and I understand these instructions.  I will adhere to these goals & educational materials that have been provided to me after my discharge from the hospital.

## 2012-03-24 NOTE — Discharge Summary (Addendum)
Physician Discharge Summary  Eric Gardner VHQ:[id number] DOB: May 17, 1961 DOA: 03/20/2012  PCP: Yisroel Ramming, MD  Admit date: 03/20/2012 Discharge date: 03/24/2012  Discharge Diagnoses:   Acute right posterior frontal cortical infarct   HIV DISEASE   Discharge Condition: Stable.  Disposition:  Follow-up Information    Follow up with Gates Rigg, MD. Schedule an appointment as soon as possible for a visit in 2 months.   Contact information:   [address], [address] [facility] [place] [address] [phone]          Diet: Dysphagia 3 diet.   History of present illness:  Eric Gardner is a 51 y.o. male  has a past medical history of Hypertension and HIV (human immunodeficiency virus infection).  Presented with facial droop and left-sided weakness unknown periods time. Seems like he woke up last night with those symptoms but did not seek medical care until today. Patient U. tox is positive for cocaine. He has been agitated in emergency department and was given IV Ativan for sedation. At this point I am unable to obtain good history secondary to patient being sedated. Brother-in-law at bedside but does not know what happened to the patient.   Hospital Course:  1 acute right posterior frontal cortical infarct  Eric Gardner is a 51 y.o. male with a right posterior frontal infarct etiology unknown. Multiple risks factors including polysubstance abuse including vasculitis associated with HIV, small vessel disease, embolic stroke, or vasospasm that can be seen with cocaine use, this episode felt secondary to microvascular disease   2-D echo with EF of 45-50%. With diffuse hypokinesis. No cardiac source of emboli noted. Carotid Dopplers with no significant ICA stenosis.. The importance of tobacco cessation and polysubstance abuse with counseled to the patient. Aspirin has been changed to Plavix. PT OT recommending SNF for short term rehab.  Follow up with Dr. Pearlean Brownie in 2 months.  2 HIV  Stable. Continue Atripla. Follow.  3 Dispo   Awaiting placement.   Discharge Exam: Filed Vitals:   03/24/12 0600  BP: 128/85  Pulse: 57  Temp: 97.8 F (36.6 C)  Resp: 18   Filed Vitals:   03/23/12 1800 03/23/12 2200 03/24/12 0200 03/24/12 0600  BP: 96/57 146/94 120/74 128/85  Pulse: 59 67 58 57  Temp: 97.9 F (36.6 C) 98.2 F (36.8 C) 98.6 F (37 C) 97.8 F (36.6 C)  TempSrc: Oral Oral Oral Oral  Resp: 18 18 18 18   Height:      Weight:      SpO2: 98% 98% 99% 98%   General: Patient is in not acute distress.  Cardiovascular: S1, S2 RRR. Respiratory: CTA. Neuro: Awake and alert, Left facial droop, left facial numbness and left upper extremities numbness.  Discharge Instructions  Discharge Orders    Future Orders Please Complete By Expires   Diet - low sodium heart healthy      Increase activity slowly        Medication List  As of 03/24/2012  8:18 AM   STOP taking these medications         aspirin 325 MG tablet         TAKE these medications         clopidogrel 75 MG tablet   Commonly known as: PLAVIX   Take 1 tablet (75 mg total) by mouth daily with breakfast.      efavirenz-emtrictabine-tenofovir 600-200-300 MG per tablet   Commonly known as: ATRIPLA  Take 1 tablet by mouth at bedtime.              The results of significant diagnostics from this hospitalization (including imaging, microbiology, ancillary and laboratory) are listed below for reference.    Significant Diagnostic Studies: Dg Chest 2 View  03/21/2012  *RADIOLOGY REPORT*  Clinical Data: Stroke.  Smoker.  No chest complaints.  CHEST - 2 VIEW  Comparison: 08/01/2009 radiographs.  Findings: Narrow AP diameter of the chest. The heart size and mediastinal contours are normal. The lungs are clear. There is no pleural effusion or pneumothorax. No acute osseous findings are identified.  Prominent nipple shadows are noted bilaterally.  IMPRESSION:  No active cardiopulmonary process.  Original Report Authenticated By: Gerrianne Scale, M.D.   Ct Head Wo Contrast  03/20/2012  *RADIOLOGY REPORT*  Clinical Data: Left arm weakness  CT HEAD WITHOUT CONTRAST  Technique:  Contiguous axial images were obtained from the base of the skull through the vertex without contrast.  Comparison: 08/03/2009 MRI  Findings: Focus of hypoattenuation within the high right frontal lobe, corresponds to the location of a focus of restricted diffusion on the 2010 MRI.  There is no evidence for acute hemorrhage, overt hydrocephalus, mass lesion, or abnormal extra-axial fluid collection.  No definite CT evidence for acute cortical based (large artery) infarction. The The visualized paranasal sinuses and mastoid air cells are predominately clear.  No displaced calvarial fracture.  IMPRESSION: Focus of hypoattenuation within the high right frontal lobe, corresponds to the location of restricted diffusion on the 2010 MRI. Therefore, may represent sequelae of prior infection or infarction.  However, if there is persistent concern for an acute intracranial process, MRI follow-up is recommend.  Original Report Authenticated By: Waneta Martins, M.D.   Mr Brain Wo Contrast  03/21/2012  *RADIOLOGY REPORT*  Clinical Data:  Left facial droop and left-sided weakness of unknown duration. HIV positive.  Hypertension.  Positive drug screen for cocaine.  MRI HEAD WITHOUT CONTRAST MRA HEAD WITHOUT CONTRAST  Technique:  Multiplanar, multiecho pulse sequences of the brain and surrounding structures were obtained without intravenous contrast. Angiographic images of the head were obtained using MRA technique without contrast.  Comparison:  CT head 03/20/2012.  MRI head 08/03/2009.  MRI HEAD  Findings:  There is an acute 11 x 26 mm region of acute infarction affecting the right posterior frontal cortex near the central sulcus.  There is no hemorrhage, mass lesion, hydrocephalus, or extra-axial fluid.   There is slight premature atrophy. Scattered foci of prolonged T2 signal and the subcortical and periventricular white matter signal are nonspecific, possibly representing chronic microvascular ischemic change due hypertension, chronic infection, or idiopathic. No midline shift.  Major intracranial vascular structures are patent.  Skull base and calvarium negative.  Unremarkable pituitary and cerebellar tonsils.  Contrast was not administered.  Compared with 2010, this infarct is slightly more posterior and correlates with the observed cytotoxic edema demonstrated on yesterday's CT.  IMPRESSION: Findings consistent with acute right posterior frontal cortical infarction.  No hemorrhage.  MRA HEAD  Findings: There are no focal lesions of the skull base, cavernous, or supraclinoid internal carotid arteries.  No visible evidence for dissection or pseudoaneurysm.  The basilar arteries widely patent.  Vertebrals are codominant.  There is no proximal stenosis of the anterior, middle, or posterior cerebral arteries.  No cerebellar branch occlusion is observed.  On submentovertex projections of the anterior circulation, there is a suggestion of diminished caliber or possibly absence of a right  M3 or M4 MCA vessel subserving the area of acute infarction.  It is unclear if this represents spasm or occlusion.  Formal catheter angiogram could be helpful in further evaluation.  IMPRESSION: No proximal stenosis, dissection or occlusion. Suspect diminished or absent flow related enhancement right posterior frontal MCA branch.  Original Report Authenticated By: Elsie Stain, M.D.   Mr Mra Head/brain Wo Cm  03/21/2012  *RADIOLOGY REPORT*  Clinical Data:  Left facial droop and left-sided weakness of unknown duration. HIV positive.  Hypertension.  Positive drug screen for cocaine.  MRI HEAD WITHOUT CONTRAST MRA HEAD WITHOUT CONTRAST  Technique:  Multiplanar, multiecho pulse sequences of the brain and surrounding structures were  obtained without intravenous contrast. Angiographic images of the head were obtained using MRA technique without contrast.  Comparison:  CT head 03/20/2012.  MRI head 08/03/2009.  MRI HEAD  Findings:  There is an acute 11 x 26 mm region of acute infarction affecting the right posterior frontal cortex near the central sulcus.  There is no hemorrhage, mass lesion, hydrocephalus, or extra-axial fluid.  There is slight premature atrophy. Scattered foci of prolonged T2 signal and the subcortical and periventricular white matter signal are nonspecific, possibly representing chronic microvascular ischemic change due hypertension, chronic infection, or idiopathic. No midline shift.  Major intracranial vascular structures are patent.  Skull base and calvarium negative.  Unremarkable pituitary and cerebellar tonsils.  Contrast was not administered.  Compared with 2010, this infarct is slightly more posterior and correlates with the observed cytotoxic edema demonstrated on yesterday's CT.  IMPRESSION: Findings consistent with acute right posterior frontal cortical infarction.  No hemorrhage.  MRA HEAD  Findings: There are no focal lesions of the skull base, cavernous, or supraclinoid internal carotid arteries.  No visible evidence for dissection or pseudoaneurysm.  The basilar arteries widely patent.  Vertebrals are codominant.  There is no proximal stenosis of the anterior, middle, or posterior cerebral arteries.  No cerebellar branch occlusion is observed.  On submentovertex projections of the anterior circulation, there is a suggestion of diminished caliber or possibly absence of a right M3 or M4 MCA vessel subserving the area of acute infarction.  It is unclear if this represents spasm or occlusion.  Formal catheter angiogram could be helpful in further evaluation.  IMPRESSION: No proximal stenosis, dissection or occlusion. Suspect diminished or absent flow related enhancement right posterior frontal MCA branch.  Original  Report Authenticated By: Elsie Stain, M.D.    Microbiology: No results found for this or any previous visit (from the past 240 hour(s)).   Labs: Basic Metabolic Panel:  Lab 03/24/12 1610 03/23/12 0229 03/22/12 0211 03/20/12 2206 03/20/12 2107  NA 141 141 141 144 143  K 4.1 3.4* -- -- --  CL 108 108 108 106 106  CO2 24 24 24  -- 24  GLUCOSE 97 91 98 61* 65*  BUN 14 10 15  24* 22  CREATININE 0.94 0.89 1.00 1.30 1.29  CALCIUM 8.5 8.3* 8.0* -- 9.6  MG -- -- -- -- --  PHOS -- -- -- -- --   Liver Function Tests:  Lab 03/20/12 2107  AST 20  ALT 20  ALKPHOS 84  BILITOT 0.3  PROT 7.7  ALBUMIN 4.4   CBC:  Lab 03/24/12 0625 03/23/12 0229 03/22/12 0211 03/20/12 2206 03/20/12 2107  WBC 2.8* 2.7* 2.7* -- 3.9*  NEUTROABS -- 0.8* -- -- 1.1*  HGB 13.3 12.7* 12.1* 17.0 15.3  HCT 39.4 37.7* 35.5* 50.0 43.7  MCV 87.2  88.1 87.7 -- 86.9  PLT 183 179 155 -- 204   Cardiac Enzymes:  Lab 03/22/12 1948 03/22/12 1404 03/22/12 0908 03/22/12 0211 03/21/12 2000  CKTOTAL 121 156 125 127 131  CKMB 2.0 1.7 1.7 2.0 2.1  CKMBINDEX -- -- -- -- --  TROPONINI <0.30 <0.30 <0.30 <0.30 <0.30   BNP: No components found with this basename: POCBNP:5 CBG:  Lab 03/23/12 1636 03/22/12 1133 03/22/12 0729 03/21/12 2132 03/20/12 2126  GLUCAP 105* 81 96 93 83    Time coordinating discharge: 30 minutes.   SignedHartley Barefoot  [facility] 03/24/2012, 8:18 AM  6-13 Patient seen and examined. No change in condition. No concern or complaint. Stable for discharge today.  Deneshia Zucker, Md.   6-14:  Patient seen , no complain feeling well, no change in medical condition. Stable for discharge.   6-15:  Patient with no complaints. Feeling well. No change in medical condition. Waiting placement.  Tate Jerkins, Md.   6-16: Feeling well. No complaints. Waiting placement.

## 2012-03-24 NOTE — Clinical Social Work Note (Signed)
CSW was unable to send referral to SNFs through [facility] as there were technical difficulties. However, CSW completed FL2 in Provider Link and added document by fax in hopes that the facilities would be able to read it. CSW will follow up with bed offers. CSW will continue to follow.   Dede Query, MSW, [facility]ors [phone]

## 2012-03-24 NOTE — Progress Notes (Signed)
Occupational Therapy Treatment Patient Details Name: Eric Gardner MRN: [id number] DOB: 05/24/1961 Today's Date: 03/24/2012 Time: 7846-9629 OT Time Calculation (min): 17 min  OT Assessment / Plan / Recommendation Comments on Treatment Session Pt continues to progress, but appears to have transient changes in vision (e.g. double vs blurry vision). Session today focused on balance and vision.     Follow Up Recommendations  Skilled nursing facility    Barriers to Discharge       Equipment Recommendations  Defer to next venue    Recommendations for Other Services    Frequency     Plan Discharge plan remains appropriate    Precautions / Restrictions Precautions Precautions: Fall   Pertinent Vitals/Pain Pt denies any pain during session    ADL  Grooming: Performed;Min guard Where Assessed - Grooming: Supported standing (unilateral support or leaning on sink) ADL Comments: Pt reported double vision to PT yesterday, but states none today- just rt eye blurry as he has been describing. Performed ball toss activity x to improve pt's UE coordination/speed, balance (pt stood with feet together and tandem; BOS challenged as well with reaching), and vision (smooth pursuits as well as convergence/divergence). Pt did well, with LOB mainly occuring when pt reaching outside of BOS. Pt fatigued at end of activity.  Pt reports numbness is LUE is better, but still present. Educated pt on rubbing UE with various textures to inc input and for desensitization. Also, educated pt on fine motor exercises for Lt hand. Pt unable to perform finger lifts on table; therefore educated to perform thumb to digit touches and increase speed as able. Pt demonstrated understanding    OT Diagnosis:    OT Problem List:   OT Treatment Interventions:     OT Goals ADL Goals ADL Goal: Grooming - Progress: Progressing toward goals ADL Goal: Additional Goal #2 - Progress: Progressing toward goals  Visit  Information  Last OT Received On: 03/24/12 Assistance Needed: +1 PT/OT Co-Evaluation/Treatment: Yes    Subjective Data      Prior Functioning       Cognition  Overall Cognitive Status: Appears within functional limits for tasks assessed/performed Arousal/Alertness: Awake/alert Orientation Level: Appears intact for tasks assessed Behavior During Session: [facility] for tasks performed    Mobility Transfers Sit to Stand: 5: Supervision;From chair/3-in-1;From bed Stand to Sit: 6: Modified independent (Device/Increase time);To chair/3-in-1 Details for Transfer Assistance: Patient with slight unsteadiness today with stand due to dizziness. Dizziness subsides after a couple of seconds   Exercises    Balance Dynamic Standing Balance Dynamic Standing - Balance Support: During functional activity;No upper extremity supported Dynamic Standing - Level of Assistance: 4: Min assist Dynamic Standing - Balance Activities: Lateral lean/weight shifting;Forward lean/weight shifting;Ball toss;Reaching for objects;Reaching across midline Dynamic Standing - Comments: Had patient change levels of BOS. Unable to step tandem and hold balance. Dynamic balance x15 minutes  End of Session OT - End of Session Equipment Utilized During Treatment: Gait belt Activity Tolerance: Patient tolerated treatment well Patient left: in chair;with call bell/phone within reach   Harrington Jobe 03/24/2012, 9:54 AM

## 2012-03-25 DIAGNOSIS — G459 Transient cerebral ischemic attack, unspecified: Secondary | ICD-10-CM

## 2012-03-25 DIAGNOSIS — B20 Human immunodeficiency virus [HIV] disease: Secondary | ICD-10-CM

## 2012-03-25 DIAGNOSIS — F1910 Other psychoactive substance abuse, uncomplicated: Secondary | ICD-10-CM

## 2012-03-25 MED ORDER — ENSURE COMPLETE PO LIQD
237.0000 mL | Freq: Two times a day (BID) | ORAL | Status: DC
  Administered 2012-03-25 – 2012-03-28 (×6): 237 mL via ORAL

## 2012-03-25 NOTE — Progress Notes (Signed)
Physical Therapy Treatment Patient Details Name: Eric Gardner MRN: [id number] DOB: [birth year] Today's Date: 03/25/2012 Time: 4098-1191 PT Time Calculation (min): 11 min  PT Assessment / Plan / Recommendation Comments on Treatment Session  Patient progressing well with dynamic gait activities. Awaiting SNF as he lives only with roommate and roommate not at the house during the day.    Follow Up Recommendations  Skilled nursing facility    Barriers to Discharge        Equipment Recommendations  Defer to next venue    Recommendations for Other Services    Frequency Min 4X/week   Plan Discharge plan remains appropriate;Frequency remains appropriate    Precautions / Restrictions Precautions Precautions: Fall   Pertinent Vitals/Pain     Mobility  Bed Mobility Supine to Sit: 7: Independent Sitting - Scoot to Edge of Bed: 7: Independent Transfers Sit to Stand: 6: Modified independent (Device/Increase time) Stand to Sit: 6: Modified independent (Device/Increase time) Ambulation/Gait Ambulation/Gait Assistance: 4: Min assist Ambulation Distance (Feet): 400 Feet Assistive device: None Ambulation/Gait Assistance Details: Patient ambulated using dynamic gait techniques. Min A for balance. Patient states that vision is still blurred causing some dizziness but that it has gotten better Gait Pattern: Narrow base of support;Step-through pattern    Exercises     PT Diagnosis:    PT Problem List:   PT Treatment Interventions:     PT Goals Acute Rehab PT Goals PT Goal: Sit to Stand - Progress: Met PT Goal: Stand to Sit - Progress: Met PT Goal: Ambulate - Progress: Progressing toward goal Additional Goals PT Goal: Additional Goal #1 - Progress: Progressing toward goal  Visit Information  Last PT Received On: 03/25/12 Assistance Needed: +1    Subjective Data  Subjective: I am glad you came by, I was starting to feel depressed   Cognition  Overall Cognitive Status: Appears  within functional limits for tasks assessed/performed Arousal/Alertness: Awake/alert Orientation Level: Appears intact for tasks assessed Behavior During Session: [facility] for tasks performed    Balance  Dynamic Standing Balance Dynamic Standing - Balance Support: No upper extremity supported;During functional activity Dynamic Standing - Level of Assistance: 3: Mod assist Dynamic Standing - Balance Activities: Forward lean/weight shifting;Lateral lean/weight shifting;Reaching for objects;Reaching across midline Dynamic Standing - Comments: Patient worked on dynamic balance with narrow BOS and picking items up off of floor Dynamic Gait Index Level Surface: Mild Impairment Change in Gait Speed: Mild Impairment Gait with Horizontal Head Turns: Mild Impairment Gait with Vertical Head Turns: Mild Impairment Gait and Pivot Turn: Moderate Impairment Step Over Obstacle: Severe Impairment Step Around Obstacles: Mild Impairment Steps: Mild Impairment Total Score: 13   End of Session PT - End of Session Equipment Utilized During Treatment: Gait belt Activity Tolerance: Patient tolerated treatment well Patient left: in chair;with call bell/phone within reach Nurse Communication: Mobility status    Fredrich Birks 03/25/2012, 11:32 AM 03/25/2012 Fredrich Birks PTA [phone] pager [phone] office

## 2012-03-25 NOTE — Progress Notes (Signed)
Nutrition Follow-up  Diet Order:  Dysphagia 3/Thin Meal completion 100%. Pt eating well, prefers ensure complete over boost. Per pt/RN would rather have supplement mid-morning and mid-afternoon, will order. Last bm 6/9 Pt awaiting placement.  Meds: Scheduled Meds:   . clopidogrel  75 mg Oral Q breakfast  . efavirenz-emtrictabine-tenofovir  1 tablet Oral QHS  . lactose free nutrition  237 mL Oral TID WC   Continuous Infusions:  PRN Meds:.acetaminophen, acetaminophen, ondansetron (ZOFRAN) IV, senna-docusate  Labs:  CMP     Component Value Date/Time   NA 141 03/24/2012 0625   K 4.1 03/24/2012 0625   CL 108 03/24/2012 0625   CO2 24 03/24/2012 0625   GLUCOSE 97 03/24/2012 0625   BUN 14 03/24/2012 0625   CREATININE 0.94 03/24/2012 0625   CALCIUM 8.5 03/24/2012 0625   PROT 7.7 03/20/2012 2107   ALBUMIN 4.4 03/20/2012 2107   AST 20 03/20/2012 2107   ALT 20 03/20/2012 2107   ALKPHOS 84 03/20/2012 2107   BILITOT 0.3 03/20/2012 2107   GFRNONAA >90 03/24/2012 0625   GFRAA >90 03/24/2012 0625   CBG (last 3)   Basename 03/23/12 2117 03/23/12 1636  GLUCAP 83 105*     Intake/Output Summary (Last 24 hours) at 03/25/12 1546 Last data filed at 03/25/12 1300  Gross per 24 hour  Intake    600 ml  Output    750 ml  Net   -150 ml    Weight Status:  No new weight  Estimated needs:  Kcal: 1700-1900; Protein: 80-90  Nutrition Dx:  Inadequate oral intake r/t decreased appetite/stress AEB limited intake at meals and 6% wt loss PTA; resolved.  Goal: Provide >/= 90% of his estimated needs, met.  Intervention:    D/C Boost Plus  Ensure complete BID  Monitor:  Po intake, weight  Kendell Bane Cornelison Pager #:  [phone]

## 2012-03-26 DIAGNOSIS — G459 Transient cerebral ischemic attack, unspecified: Secondary | ICD-10-CM

## 2012-03-26 DIAGNOSIS — F1910 Other psychoactive substance abuse, uncomplicated: Secondary | ICD-10-CM

## 2012-03-26 DIAGNOSIS — B20 Human immunodeficiency virus [HIV] disease: Secondary | ICD-10-CM

## 2012-03-26 NOTE — Clinical Social Work Note (Signed)
CSW met with pt to address discharge plan and provide bed offers. Per pt request, CSW will contact the facilities that are "considering" and follow up with pt with bed offers. CSW will continue to follow.   Dede Query, MSW, [facility] [phone]

## 2012-03-26 NOTE — Progress Notes (Signed)
Speech Language Pathology Treatment Patient Details Name: Eric Gardner MRN: [id number] DOB: Jul 16, 1961 Today's Date: 03/26/2012 Time: 4098-1191 SLP Time Calculation (min): 18 min  Assessment / Plan / Recommendation Clinical Impression  Treatment focused on speech intellibility.  Pt. in bed, alert with friends present.  Pt. required mod-max cues to recall speech strategies.  Pt. was 100% intelligible in conversation with mild phoneme distortions in a quiet environment.  Pt. to be discharged to SNF briefly before returning to hometown of [place].  ST will sign off at this time as speech intelligibilty is functional in current environment.  Pt. reporting difficulty at times with memory.  Pt. can be screened once in SNF for any further ST needs.       SLP Plan  Discharge SLP treatment due to (comment);Other (Comment) (Partially met goals, ST at SNF if needed)       SLP Goals  SLP Goals SLP Goal #1 - Progress: Partly met SLP Goal #2 - Progress: Partly met  General Temperature Spikes Noted: No Behavior/Cognition: Alert;Cooperative;Pleasant mood Oral Cavity - Dentition: Dentures, top Patient Positioning: Upright in bed  Oral Cavity - Oral Hygiene Does patient have any of the following "at risk" factors?: None of the above Brush patient's teeth BID with toothbrush (using toothpaste with fluoride): Yes   Treatment Treatment focused on: Dysarthria Skilled Treatment: Treatment focused on speech intellibility.  Pt. in bed, alert with friends present.  Pt. required mod-max cues to recall speech strategies.  Pt. was 100% intelligible in conversation with mild phoneme distortions in a quiet environment.  Pt. to be discharged to SNF briefly before returning to hometown of [place].  ST will sign off at this time as speech intelligibilty is functional in current environment.  Pt. reporting difficulty at times with memory.  Pt. can be screened once in SNF for any further ST needs.       Eric Gardner [place].Ed [employer] [phone]  03/26/2012

## 2012-03-26 NOTE — Progress Notes (Signed)
Physical Therapy Treatment Patient Details Name: Eric Gardner MRN: [id number] DOB: 1960/12/10 Today's Date: 03/26/2012 Time: 1308-6578 PT Time Calculation (min): 28 min  PT Assessment / Plan / Recommendation Comments on Treatment Session  Patient progressing well with therapy.  Definite decrease in balance with high level activities. Will continue to challenge patient's balance to decrease fall risk.    Follow Up Recommendations  Skilled nursing facility    Barriers to Discharge        Equipment Recommendations  Defer to next venue    Recommendations for Other Services    Frequency Min 4X/week   Plan Discharge plan remains appropriate;Frequency remains appropriate    Precautions / Restrictions Precautions Precautions: Fall Restrictions Weight Bearing Restrictions: No       Mobility  Bed Mobility Bed Mobility: Supine to Sit;Sitting - Scoot to Edge of Bed Supine to Sit: 7: Independent Sitting - Scoot to Edge of Bed: 7: Independent Details for Bed Mobility Assistance: No verbal cues required. Transfers Transfers: Sit to Stand;Stand to Sit Sit to Stand: 5: Supervision;From bed;With upper extremity assist (without assistive device) Stand to Sit: 5: Supervision;To bed;With upper extremity assist Details for Transfer Assistance: Patient slightly unsteady with transition - vision issues and slight dizziness. Ambulation/Gait Ambulation/Gait Assistance: 4: Min assist Ambulation Distance (Feet): 400 Feet Assistive device: None Ambulation/Gait Assistance Details: Assist for stability, especially with high level balance work. Gait Pattern: Step-through pattern;Narrow base of support Gait velocity: decreased gait speed.  Patient able to change gait speed without loss of balance.    Exercises Other Exercises Other Exercises: shallow squats feet even - 10 Other Exercises: shallow squats feet askew - 20 Other Exercises: tandem stance - 30 seconds Other Exercises: 1-leg stance  right - 10 sec Other Exercises: 1-leg stance left - 4 sec   PT Goals Acute Rehab PT Goals PT Goal: Supine/Side to Sit - Progress: Met PT Goal: Sit to Stand - Progress: Met PT Goal: Stand to Sit - Progress: Met PT Goal: Ambulate - Progress: Progressing toward goal (Achieved distance, but balance remains issue)  Visit Information  Last PT Received On: 03/26/12 Assistance Needed: +1    Subjective Data  Subjective: "I appreciate yall working with me.  I just want to get better."   Cognition  Overall Cognitive Status: Appears within functional limits for tasks assessed/performed Arousal/Alertness: Awake/alert Orientation Level: Appears intact for tasks assessed Behavior During Session: [facility] for tasks performed    Balance  Balance Balance Assessed: Yes High Level Balance High Level Balance Activites: Side stepping;Backward walking;Direction changes;Turns;Head turns High Level Balance Comments: Patient with decrease in balance during these high level balance activities  End of Session PT - End of Session Activity Tolerance: Patient tolerated treatment well Patient left: in chair;with call bell/phone within reach Nurse Communication: Mobility status    Vena Austria 03/26/2012, 3:37 PM Durenda Hurt. Renaldo Fiddler, [facility] Acute Rehab Services Pager [phone]

## 2012-03-26 NOTE — Discharge Summary (Addendum)
Speech Language Pathology Discharge Patient Details Name: Eric Gardner MRN: [id number] DOB: June 19, 1961 Today's Date: 03/26/2012 Time: 0865-7846 SLP Time Calculation (min): 18 min  Patient discharged from SLP services secondary to no further speech or swallowing needs in acute care setting.  Please see latest therapy progress note for current level of functioning and progress toward goals.    Progress and discharge plan discussed with patient and/or caregiver: Patient/Caregiver agrees with plan  Roque Cash [name] 03/26/2012, 1:30 PM

## 2012-03-26 NOTE — Consult Note (Signed)
Pt is a smoker of about 1/2 ppd however after this he says he's definitely not smoking again referring to his having had a stroke. Encouraged pt to quit completely. Pt in action stage. Referred to 1-800 quit now for f/u and support. Discussed oral fixation substitutes, second hand smoke and in home smoking policy. Reviewed and gave pt Written education/contact information.

## 2012-03-27 DIAGNOSIS — G459 Transient cerebral ischemic attack, unspecified: Secondary | ICD-10-CM

## 2012-03-27 DIAGNOSIS — B20 Human immunodeficiency virus [HIV] disease: Secondary | ICD-10-CM

## 2012-03-27 DIAGNOSIS — F1910 Other psychoactive substance abuse, uncomplicated: Secondary | ICD-10-CM

## 2012-03-28 DIAGNOSIS — G459 Transient cerebral ischemic attack, unspecified: Secondary | ICD-10-CM

## 2012-03-28 DIAGNOSIS — F1910 Other psychoactive substance abuse, uncomplicated: Secondary | ICD-10-CM

## 2012-03-28 DIAGNOSIS — B20 Human immunodeficiency virus [HIV] disease: Secondary | ICD-10-CM

## 2012-03-28 NOTE — Progress Notes (Signed)
CSW met with pt to discuss SNF that were able to accept him for rehab services. At this time, pt has identified [facility] as the bed offer he wanted to accept. CSW contacted Dois Davenport at [facility] to accept the bed offer. CSW faxed pt's FL2 and D/C summary to [facility] as requested. RN contacted pt's MD who agreed with discharge plan. Pt was agreeable to discharge to [facility] today by [facility]. CSW made RN aware to call report to [facility] [phone]) Laqueta Linden. CSW contacted [facility] to schedule transport. Pt's discharge packet has been placed in wall chart.   CSW signing off at this time.

## 2012-03-28 NOTE — Progress Notes (Signed)
Patient signed and stated understanding of discharge papers/education. No IV's on patient to remove. Rx for plavix in the envelope for [facility] transporters. Report called to [facility]. Pt with no complaints, awaiting discharge transportation.   Minor, Yvette Rack

## 2014-04-06 ENCOUNTER — Encounter: Payer: Self-pay | Admitting: *Deleted

## 2020-04-09 ENCOUNTER — Inpatient Hospital Stay (HOSPITAL_COMMUNITY)
Admission: EM | Admit: 2020-04-09 | Discharge: 2020-04-16 | DRG: 177 | Disposition: A | Discharge status: Home / Self Care | Payer: Medicare Other | Attending: Internal Medicine | Admitting: Internal Medicine

## 2020-04-09 ENCOUNTER — Emergency Department (HOSPITAL_COMMUNITY): Payer: Medicare Other

## 2020-04-09 ENCOUNTER — Encounter (HOSPITAL_COMMUNITY): Payer: Self-pay

## 2020-04-09 ENCOUNTER — Other Ambulatory Visit: Payer: Self-pay

## 2020-04-09 DIAGNOSIS — J9601 Acute respiratory failure with hypoxia: Secondary | ICD-10-CM | POA: Diagnosis present

## 2020-04-09 DIAGNOSIS — Z21 Asymptomatic human immunodeficiency virus [HIV] infection status: Secondary | ICD-10-CM | POA: Diagnosis present

## 2020-04-09 DIAGNOSIS — Y95 Nosocomial condition: Secondary | ICD-10-CM | POA: Diagnosis present

## 2020-04-09 DIAGNOSIS — I10 Essential (primary) hypertension: Secondary | ICD-10-CM | POA: Diagnosis present

## 2020-04-09 DIAGNOSIS — F17210 Nicotine dependence, cigarettes, uncomplicated: Secondary | ICD-10-CM | POA: Diagnosis present

## 2020-04-09 DIAGNOSIS — E875 Hyperkalemia: Secondary | ICD-10-CM | POA: Diagnosis present

## 2020-04-09 DIAGNOSIS — Z833 Family history of diabetes mellitus: Secondary | ICD-10-CM | POA: Diagnosis not present

## 2020-04-09 DIAGNOSIS — Z8701 Personal history of pneumonia (recurrent): Secondary | ICD-10-CM | POA: Diagnosis not present

## 2020-04-09 DIAGNOSIS — Z8673 Personal history of transient ischemic attack (TIA), and cerebral infarction without residual deficits: Secondary | ICD-10-CM

## 2020-04-09 DIAGNOSIS — Z20822 Contact with and (suspected) exposure to covid-19: Secondary | ICD-10-CM | POA: Diagnosis present

## 2020-04-09 DIAGNOSIS — B20 Human immunodeficiency virus [HIV] disease: Secondary | ICD-10-CM | POA: Diagnosis not present

## 2020-04-09 DIAGNOSIS — J869 Pyothorax without fistula: Principal | ICD-10-CM | POA: Diagnosis present

## 2020-04-09 DIAGNOSIS — J90 Pleural effusion, not elsewhere classified: Secondary | ICD-10-CM | POA: Diagnosis present

## 2020-04-09 DIAGNOSIS — R001 Bradycardia, unspecified: Secondary | ICD-10-CM | POA: Diagnosis not present

## 2020-04-09 DIAGNOSIS — I447 Left bundle-branch block, unspecified: Secondary | ICD-10-CM | POA: Diagnosis present

## 2020-04-09 DIAGNOSIS — J189 Pneumonia, unspecified organism: Secondary | ICD-10-CM

## 2020-04-09 DIAGNOSIS — Z792 Long term (current) use of antibiotics: Secondary | ICD-10-CM | POA: Diagnosis not present

## 2020-04-09 DIAGNOSIS — Z7902 Long term (current) use of antithrombotics/antiplatelets: Secondary | ICD-10-CM | POA: Diagnosis not present

## 2020-04-09 DIAGNOSIS — I639 Cerebral infarction, unspecified: Secondary | ICD-10-CM | POA: Diagnosis present

## 2020-04-09 DIAGNOSIS — Z79899 Other long term (current) drug therapy: Secondary | ICD-10-CM | POA: Diagnosis not present

## 2020-04-09 DIAGNOSIS — Z9689 Presence of other specified functional implants: Secondary | ICD-10-CM

## 2020-04-09 DIAGNOSIS — D638 Anemia in other chronic diseases classified elsewhere: Secondary | ICD-10-CM | POA: Diagnosis present

## 2020-04-09 DIAGNOSIS — Z681 Body mass index (BMI) 19 or less, adult: Secondary | ICD-10-CM

## 2020-04-09 DIAGNOSIS — J918 Pleural effusion in other conditions classified elsewhere: Secondary | ICD-10-CM | POA: Diagnosis present

## 2020-04-09 DIAGNOSIS — Z791 Long term (current) use of non-steroidal anti-inflammatories (NSAID): Secondary | ICD-10-CM

## 2020-04-09 LAB — TROPONIN I (HIGH SENSITIVITY)
Troponin I (High Sensitivity): 10 ng/L (ref <18)
Troponin I (High Sensitivity): 10 ng/L (ref <18)

## 2020-04-09 LAB — CBC
HCT: 35.3 % — ABNORMAL LOW (ref 39.0–52.0)
Hemoglobin: 11.6 g/dL — ABNORMAL LOW (ref 13.0–17.0)
MCH: 28.1 pg (ref 26.0–34.0)
MCHC: 32.9 g/dL (ref 30.0–36.0)
MCV: 85.5 fL (ref 80.0–100.0)
Platelets: 549 10*3/uL — ABNORMAL HIGH (ref 150–400)
RBC: 4.13 MIL/uL — ABNORMAL LOW (ref 4.22–5.81)
RDW: 15.5 % (ref 11.5–15.5)
WBC: 8.4 10*3/uL (ref 4.0–10.5)
nRBC: 0 % (ref 0.0–0.2)

## 2020-04-09 LAB — I-STAT CHEM 8, ED
BUN: 16 mg/dL (ref 6–20)
Calcium, Ion: 1.15 mmol/L (ref 1.15–1.40)
Chloride: 101 mmol/L (ref 98–111)
Creatinine, Ser: 1.1 mg/dL (ref 0.61–1.24)
Glucose, Bld: 85 mg/dL (ref 70–99)
HCT: 37 % — ABNORMAL LOW (ref 39.0–52.0)
Hemoglobin: 12.6 g/dL — ABNORMAL LOW (ref 13.0–17.0)
Potassium: 4.2 mmol/L (ref 3.5–5.1)
Sodium: 140 mmol/L (ref 135–145)
TCO2: 28 mmol/L (ref 22–32)

## 2020-04-09 LAB — SARS CORONAVIRUS 2 BY RT PCR (HOSPITAL ORDER, PERFORMED IN ~~LOC~~ HOSPITAL LAB): SARS Coronavirus 2: NEGATIVE

## 2020-04-09 MED ORDER — SODIUM CHLORIDE 0.9% FLUSH: 3.0000 mL | Freq: Once | INTRAVENOUS | Status: AC

## 2020-04-09 MED ORDER — BICTEGRAVIR-EMTRICITAB-TENOFOV 50-200-25 MG PO TABS
1.0000 | ORAL_TABLET | Freq: Every day | ORAL | Status: DC
  Administered 2020-04-10 – 2020-04-16 (×7): 1 via ORAL
  Filled 2020-04-09 (×7): qty 1

## 2020-04-09 MED ORDER — SODIUM CHLORIDE (PF) 0.9 % IJ SOLN
INTRAMUSCULAR | Status: AC
  Administered 2020-04-09: 3 mL via INTRAVENOUS
  Filled 2020-04-09: qty 50

## 2020-04-09 MED ORDER — OXYCODONE HCL 5 MG PO TABS
5.0000 mg | ORAL_TABLET | Freq: Once | ORAL | Status: AC
  Administered 2020-04-09: 5 mg via ORAL
  Filled 2020-04-09: qty 1

## 2020-04-09 MED ORDER — KETOROLAC TROMETHAMINE 30 MG/ML IJ SOLN
15.0000 mg | Freq: Once | INTRAMUSCULAR | Status: AC
  Administered 2020-04-09: 15 mg via INTRAVENOUS
  Filled 2020-04-09: qty 1

## 2020-04-09 MED ORDER — PIPERACILLIN-TAZOBACTAM 3.375 G IVPB 30 MIN
3.3750 g | Freq: Once | INTRAVENOUS | Status: AC
  Administered 2020-04-09: 3.375 g via INTRAVENOUS
  Filled 2020-04-09: qty 50

## 2020-04-09 MED ORDER — VANCOMYCIN HCL 1250 MG/250ML IV SOLN
1250.0000 mg | Freq: Once | INTRAVENOUS | Status: AC
  Administered 2020-04-09 (×2): 1250 mg via INTRAVENOUS
  Filled 2020-04-09: qty 250

## 2020-04-09 MED ORDER — SODIUM CHLORIDE 0.9 % IV SOLN
2.0000 g | Freq: Every day | INTRAVENOUS | Status: DC
  Administered 2020-04-10: 2 g via INTRAVENOUS
  Filled 2020-04-09: qty 20

## 2020-04-09 MED ORDER — IOHEXOL 350 MG/ML SOLN
100.0000 mL | Freq: Once | INTRAVENOUS | Status: AC | PRN
  Administered 2020-04-09: 100 mL via INTRAVENOUS

## 2020-04-09 MED ORDER — ONDANSETRON HCL 4 MG PO TABS: 4.0000 mg | ORAL_TABLET | Freq: Four times a day (QID) | ORAL | Status: DC | PRN

## 2020-04-09 MED ORDER — SODIUM CHLORIDE 0.9 % IV SOLN
500.0000 mg | Freq: Every day | INTRAVENOUS | Status: AC
  Administered 2020-04-09 – 2020-04-13 (×5): 500 mg via INTRAVENOUS
  Filled 2020-04-09 (×5): qty 500

## 2020-04-09 MED ORDER — ONDANSETRON HCL 4 MG/2ML IJ SOLN: 4.0000 mg | Freq: Four times a day (QID) | INTRAMUSCULAR | Status: DC | PRN

## 2020-04-09 MED ORDER — ACETAMINOPHEN 500 MG PO TABS
1000.0000 mg | ORAL_TABLET | Freq: Once | ORAL | Status: AC
  Administered 2020-04-09: 1000 mg via ORAL
  Filled 2020-04-09: qty 2

## 2020-04-09 NOTE — ED Notes (Addendum)
1st set of Blood Cultures obtained from Left Forearm 2nd set Blood Cultures obtained upper left arm Blood cultures order placed after antibiotics were administered

## 2020-04-09 NOTE — ED Triage Notes (Signed)
Patient c/o SOB and chest pain x 3-4 days. Patient states he recently had a stroke and pneumonia. Patient states he is currently on Plavix.

## 2020-04-09 NOTE — ED Provider Notes (Signed)
[facility]-EMERGENCY DEPT Provider Note   CSN: [id number] Arrival date & time: 04/09/20  1645     History Chief Complaint  Patient presents with  . Shortness of Breath  . Chest Pain    Eric Gardner is a 59 y.o. male.  59 yo M with a chief complaints of left-sided chest pain.  This is been an ongoing issue for him.  He says he was recently in the hospital in [place] and was diagnosed with a stroke and then left-sided pneumonia.  He is finished all his antibiotics for his pneumonia and his family had taken him to [place] to help him recover.  Unfortunately he is continuing to have pain to his left side.  Worse with deep breathing.  States that he lays in a certain way it makes it worse as well.  Denies continued cough or fever.  Patient states he has never had cough and fever.  The history is provided by the patient.  Shortness of Breath Severity:  Mild Onset quality:  Gradual Duration:  2 days Timing:  Constant Progression:  Worsening Chronicity:  New Relieved by:  Nothing Worsened by:  Nothing Ineffective treatments:  None tried Associated symptoms: chest pain   Associated symptoms: no abdominal pain, no fever, no headaches, no rash and no vomiting   Chest Pain Associated symptoms: shortness of breath   Associated symptoms: no abdominal pain, no fever, no headache, no palpitations and no vomiting        Past Medical History:  Diagnosis Date  . HIV (human immunodeficiency virus infection) (HCC)   . Hypertension     Patient Active Problem List   Diagnosis Date Noted  . CVA (cerebral infarction) 03/21/2012  . HIV DISEASE 09/29/2007  . PNEUMOCYSTIS PNEUMONIA 09/29/2007  . MITRAL VALVE PROLAPSE 09/29/2007  . PNEUMONIA, HX OF 09/29/2007    History reviewed. No pertinent surgical history.     History reviewed. No pertinent family history.  Social History   Tobacco Use  . Smoking status: Current Every Day Smoker  . Smokeless tobacco:  Never Used  Vaping Use  . Vaping Use: Never used  Substance Use Topics  . Alcohol use: No  . Drug use: No    Home Medications Prior to Admission medications   Medication Sig Start Date End Date Taking? Authorizing Provider  BIKTARVY 50-200-25 MG TABS tablet Take 1 tablet by mouth daily. 03/22/20  Yes [provider]  clopidogrel (PLAVIX) 75 MG tablet Take 75 mg by mouth daily. 03/27/20  Yes [provider]  gabapentin (NEURONTIN) 300 MG capsule Take 300 mg by mouth daily as needed (nerve pain).  03/27/20  Yes [provider]  Multiple Vitamins-Minerals (THERA-TABS M) TABS Take 1 tablet by mouth daily. 10/17/19  Yes [provider]  Vitamin D, Ergocalciferol, (DRISDOL) 1.25 MG (50000 UNIT) CAPS capsule Take 50,000 Units by mouth 2 (two) times a week. 03/22/20  Yes [provider]  diclofenac Sodium (VOLTAREN) 1 % GEL Apply topically. Patient not taking: Reported on 04/09/2020 03/26/20   [provider]  efavirenz-emtrictabine-tenofovir (ATRIPLA) 600-200-300 MG per tablet Take 1 tablet by mouth at bedtime. Patient not taking: Reported on 04/09/2020    [provider]  hydrochlorothiazide (HYDRODIURIL) 25 MG tablet Take 25 mg by mouth every morning. Patient not taking: Reported on 04/09/2020 02/23/20   [provider]  hydrOXYzine (VISTARIL) 25 MG capsule Take 25 mg by mouth 2 (two) times daily as needed. Patient not taking: Reported on 04/09/2020  02/23/20   [provider]  levofloxacin (LEVAQUIN) 750 MG tablet Take 750 mg by mouth daily. Patient not taking: Reported on 04/09/2020 04/04/20   [provider]  meloxicam (MOBIC) 7.5 MG tablet Take 7.5 mg by mouth 2 (two) times daily as needed. Patient not taking: Reported on 04/09/2020 03/21/20   [provider]    Allergies    Atorvastatin  Review of Systems   Review of Systems  Constitutional: Negative for chills and fever.  HENT: Negative for congestion  and facial swelling.   Eyes: Negative for discharge and visual disturbance.  Respiratory: Positive for shortness of breath.   Cardiovascular: Positive for chest pain. Negative for palpitations.  Gastrointestinal: Negative for abdominal pain, diarrhea and vomiting.  Musculoskeletal: Negative for arthralgias and myalgias.  Skin: Negative for color change and rash.  Neurological: Negative for tremors, syncope and headaches.  Psychiatric/Behavioral: Negative for confusion and dysphoric mood.    Physical Exam Updated Vital Signs BP 125/78 (BP Location: Left Arm)   Pulse 76   Temp 98.1 F (36.7 C) (Oral)   Resp 20   Ht 5\' 9"  (1.753 m)   Wt 55.8 kg   SpO2 97%   BMI 18.16 kg/m   Physical Exam Vitals and nursing note reviewed.  Constitutional:      Appearance: He is well-developed.  HENT:     Head: Normocephalic and atraumatic.  Eyes:     Pupils: Pupils are equal, round, and reactive to light.  Neck:     Vascular: No JVD.  Cardiovascular:     Rate and Rhythm: Normal rate and regular rhythm.     Heart sounds: No murmur heard.  No friction rub. No gallop.   Pulmonary:     Effort: No respiratory distress.     Breath sounds: No wheezing.  Chest:     Chest wall: Tenderness (to palpation to the left chest wall) present.  Abdominal:     General: There is no distension.     Tenderness: There is no guarding or rebound.  Musculoskeletal:        General: Normal range of motion.     Cervical back: Normal range of motion and neck supple.  Skin:    Coloration: Skin is not pale.     Findings: No rash.  Neurological:     Mental Status: He is alert and oriented to person, place, and time.  Psychiatric:        Behavior: Behavior normal.     ED Results / Procedures / Treatments   Labs (all labs ordered are listed, but only abnormal results are displayed) Labs Reviewed  CBC - Abnormal; Notable for the following components:      Result Value   RBC 4.13 (*)    Hemoglobin 11.6 (*)      HCT 35.3 (*)    Platelets 549 (*)    All other components within normal limits  I-STAT CHEM 8, ED - Abnormal; Notable for the following components:   Hemoglobin 12.6 (*)    HCT 37.0 (*)    All other components within normal limits  SARS CORONAVIRUS 2 BY RT PCR (HOSPITAL ORDER, PERFORMED IN [facility])  BASIC METABOLIC PANEL  TROPONIN I (HIGH SENSITIVITY)  TROPONIN I (HIGH SENSITIVITY)    EKG EKG Interpretation  Date/Time:  Monday April 09 2020 16:58:51 EDT Ventricular Rate:  84 PR Interval:    QRS Duration: 123 QT Interval:  404 QTC Calculation: 478 R Axis:   -13  Text Interpretation: Sinus rhythm Atrial premature complexes Biatrial enlargement Left bundle branch block 12 Lead; Mason-Likar No significant change since last tracing Confirmed by Deno Etienne [phone]) on 04/09/2020 5:32:08 PM   Radiology DG Chest 2 View  Result Date: 04/09/2020 CLINICAL DATA:  Chest pain. Additional provided: Shortness of breath and chest pain for 3-4 days, patient reports recent stroke and pneumonia, HIV, smoker, hypertension. EXAM: CHEST - 2 VIEW COMPARISON:  Prior chest radiographs 03/21/2012. FINDINGS: Heart size within normal limits. There is abnormal opacity throughout much of the left lung field with associated left-sided volume loss and air lucency outlining the aortic knob. Findings likely reflect significant left upper and lower lobe atelectasis with hyperexpansion of the superior segment of the left lower lobe. Probable underlying airspace consolidation within the left lower lobe. The right lung is clear. No definite pleural effusion or evidence of pneumothorax. No acute bony abnormality identified. These results were called by telephone at the time of interpretation on 04/09/2020 at 6:05 pm to provider Savaya Hakes , who verbally acknowledged these results. IMPRESSION: Abnormal opacity throughout much of the left lung field with associated left-sided volume loss. Findings likely reflect  significant partial collapse of the left upper and lower lobes. Probable underlying airspace consolidation within the left lower lobe. Contrast-enhanced chest CT is recommended for further evaluation and to exclude a central obstructing mass. The right lung is clear. Electronically Signed   By: Kellie Simmering DO   On: 04/09/2020 18:06   CT Angio Chest PE W and/or Wo Contrast  Result Date: 04/09/2020 CLINICAL DATA:  Shortness of breath and chest pain. Stroke and pneumonia. EXAM: CT ANGIOGRAPHY CHEST WITH CONTRAST TECHNIQUE: Multidetector CT imaging of the chest was performed using the standard protocol during bolus administration of intravenous contrast. Multiplanar CT image reconstructions and MIPs were obtained to evaluate the vascular anatomy. CONTRAST:  1100mL OMNIPAQUE IOHEXOL 350 MG/ML SOLN COMPARISON:  Chest radiography same day FINDINGS: Cardiovascular: Pulmonary arterial opacification is good. There are no pulmonary emboli. Mediastinum/Nodes: No mass or lymphadenopathy. Lungs/Pleura: On the left, there is a loculated pleural effusion in the posterior chest with associated compressive atelectasis of the posterior left lung. The non compressed lung appears clear. On the right, there is mild dependent atelectasis. The patient does have some emphysematous change in the lungs, more towards the inferior lungs than the superior lungs. Upper Abdomen: Negative Musculoskeletal: Normal.  Pectus configuration of the chest. Review of the MIP images confirms the above findings. IMPRESSION: No pulmonary emboli. Large loculated pleural fluid collection in the left posterior chest suggesting possible empyema. Compressive atelectasis of the posterior adjacent left lung. Aerated lung does not show obvious infiltrate. Emphysematous changes, basilar predominant. Emphysema (ICD10-J43.9). Electronically Signed   By: Nelson Chimes M.D.   On: 04/09/2020 20:09    Procedures Procedures (including critical care time)  Medications  Ordered in ED Medications  sodium chloride flush (NS) 0.9 % injection 3 mL (has no administration in time range)  sodium chloride (PF) 0.9 % injection (has no administration in time range)  vancomycin (VANCOREADY) IVPB 1250 mg/250 mL (has no administration in time range)  piperacillin-tazobactam (ZOSYN) IVPB 3.375 g (3.375 g Intravenous New Bag/Given 04/09/20 2136)  acetaminophen (TYLENOL) tablet 1,000 mg (1,000 mg Oral Given 04/09/20 1804)  oxyCODONE (Oxy IR/ROXICODONE) immediate release tablet 5 mg (5 mg Oral Given 04/09/20 1804)  ketorolac (TORADOL) 30 MG/ML injection 15 mg (15 mg Intravenous Given 04/09/20 1805)  iohexol (OMNIPAQUE) 350 MG/ML injection 100 mL (100 mLs Intravenous Contrast  Given 04/09/20 1939)    ED Course  I have reviewed the triage vital signs and the nursing notes.  Pertinent labs & imaging results that were available during my care of the patient were reviewed by me and considered in my medical decision making (see chart for details).    MDM Rules/Calculators/A&P                          59 yo M with a chief complaints of left-sided chest pain.  Sharp reproducible on palpation.  He had the same pain when he was diagnosed with pneumonia and he was in a hospital in [place].  Chest x-ray here today with what is likely a persistent infiltrate.  Unfortunately I am unable to compare imaging studies.  I also am having difficulty obtaining records.  Chest x-ray read with concern for persistent left lower lobe pneumonia with collapse of the middle and upper lung.  There is also concern for a central mass and radiology discussed this with me and is recommending a CT scan.  CT scan with empyema.  Discussed with Dr. Cliffton Asters, CT surgery.  Recommended admission to [facility] and they would evaluate for possible chest tube or surgical procedure.  The patients results and plan were reviewed and discussed.   Any x-rays performed were independently reviewed by myself.    Differential diagnosis were considered with the presenting HPI.  Medications  sodium chloride flush (NS) 0.9 % injection 3 mL (has no administration in time range)  sodium chloride (PF) 0.9 % injection (has no administration in time range)  vancomycin (VANCOREADY) IVPB 1250 mg/250 mL (has no administration in time range)  piperacillin-tazobactam (ZOSYN) IVPB 3.375 g (3.375 g Intravenous New Bag/Given 04/09/20 2136)  acetaminophen (TYLENOL) tablet 1,000 mg (1,000 mg Oral Given 04/09/20 1804)  oxyCODONE (Oxy IR/ROXICODONE) immediate release tablet 5 mg (5 mg Oral Given 04/09/20 1804)  ketorolac (TORADOL) 30 MG/ML injection 15 mg (15 mg Intravenous Given 04/09/20 1805)  iohexol (OMNIPAQUE) 350 MG/ML injection 100 mL (100 mLs Intravenous Contrast Given 04/09/20 1939)    Vitals:   04/09/20 1830 04/09/20 1900 04/09/20 1957 04/09/20 2130  BP: 124/75 134/84  125/78  Pulse: 79 81  76  Resp: 20 20  20   Temp:    98.1 F (36.7 C)  TempSrc:    Oral  SpO2: 100% 97%  97%  Weight:   55.8 kg   Height:   5\' 9"  (1.753 m)     Final diagnoses:  Empyema (HCC)    Admission/ observation were discussed with the admitting physician, patient and/or family and they are comfortable with the plan.    Final Clinical Impression(s) / ED Diagnoses Final diagnoses:  Empyema [facility])    Rx / DC Orders ED Discharge Orders    None       , DO 04/09/20 2149

## 2020-04-09 NOTE — H&P (Signed)
History and Physical    Eric Gardner URK:[id number] DOB: 06-Nov-1960 DOA: 04/09/2020  PCP: Aldona Bar, MD  Patient coming from: Home.  Chief Complaint: Left-sided chest pain.  HPI: Eric Gardner is a 59 y.o. male with history of HIV and previous stroke chronic anemia tobacco abuse was recently admitted in [place] about a week ago for pneumonia details of which are not available to [place] patient states he was in the hospital for 4 days was discharged home on antibiotics course of which patient has completed but has been having persistent left-sided chest pain on deep inspiration.  No productive cough or fever at this time.  Patient states when he was admitted at that [facility] he went there with fever chills and left-sided chest pain.  Denies nausea vomiting diarrhea.  Smokes cigarettes denies drinking alcohol.  Has been compliant with his HIV medications.  ED Course: In the ER patient was not hypoxic and not febrile.  CT chest shows left-sided loculated effusion concerning for empyema and on-call cardiothoracic surgeon was consulted by the ER physician and recommended transfer to [facility] and they will be seeing patient in consult.  Patient had empiric antibiotic started and Covid test negative.  EKG shows normal sinus rhythm with LBBB which is chronic.  Labs show anemia which appears to be chronic when compared to the labs available to [place] in 2013.  High-sensitivity troponins are negative.  Review of Systems: As per HPI, rest all negative.   Past Medical History:  Diagnosis Date   HIV (human immunodeficiency virus infection) ([place])    Hypertension     History reviewed. No pertinent surgical history.   reports that he has been smoking. He has never used smokeless tobacco. He reports that he does not drink alcohol and does not use drugs.  Allergies  Allergen Reactions   Atorvastatin Other (See Comments)    Unknown    Family History  Problem Relation Age of Onset     Diabetes Mellitus II Father    Stroke Neg Hx     Prior to Admission medications   Medication Sig Start Date End Date Taking? Authorizing Provider  BIKTARVY 50-200-25 MG TABS tablet Take 1 tablet by mouth daily. 03/22/20  Yes [provider]  clopidogrel (PLAVIX) 75 MG tablet Take 75 mg by mouth daily. 03/27/20  Yes [provider]  gabapentin (NEURONTIN) 300 MG capsule Take 300 mg by mouth daily as needed (nerve pain).  03/27/20  Yes [provider]  Multiple Vitamins-Minerals (THERA-TABS M) TABS Take 1 tablet by mouth daily. 10/17/19  Yes [provider]  Vitamin D, Ergocalciferol, (DRISDOL) 1.25 MG (50000 UNIT) CAPS capsule Take 50,000 Units by mouth 2 (two) times a week. 03/22/20  Yes [provider]  diclofenac Sodium (VOLTAREN) 1 % GEL Apply topically. Patient not taking: Reported on 04/09/2020 03/26/20   [provider]  efavirenz-emtrictabine-tenofovir (ATRIPLA) 600-200-300 MG per tablet Take 1 tablet by mouth at bedtime. Patient not taking: Reported on 04/09/2020    [provider]  hydrochlorothiazide (HYDRODIURIL) 25 MG tablet Take 25 mg by mouth every morning. Patient not taking: Reported on 04/09/2020 02/23/20   [provider]  hydrOXYzine (VISTARIL) 25 MG capsule Take 25 mg by mouth 2 (two) times daily as needed. Patient not taking: Reported on 04/09/2020 02/23/20   [provider]  levofloxacin (LEVAQUIN) 750 MG tablet Take 750 mg by mouth daily. Patient not taking: Reported on 04/09/2020 04/04/20   [provider]  meloxicam (MOBIC) 7.5 MG tablet Take 7.5 mg by mouth 2 (two) times daily as needed. Patient not taking: Reported on 04/09/2020 03/21/20   [provider]    Physical Exam: Constitutional: Moderately built and nourished. Vitals:   04/09/20 1900 04/09/20 1957 04/09/20 2130 04/09/20 2201  BP: 134/84  125/78 (!) 139/91  Pulse: 81  76 79  Resp: 20  20 (!) 26  Temp:   98.1 F  (36.7 C)   TempSrc:   Oral   SpO2: 97%  97% 97%  Weight:  55.8 kg    Height:  5\' 9"  (1.753 m)     Eyes: Anicteric no pallor. ENMT: No discharge from the ears eyes nose or mouth. Neck: No mass felt.  No neck rigidity. Respiratory: No rhonchi or crepitations. Cardiovascular: S1-S2 heard. Abdomen: Soft nontender bowel sounds present. Musculoskeletal: No edema. Skin: No rash. Neurologic: Alert awake oriented to time place and person.  Moves all extremities. Psychiatric: Appears normal per normal affect.   Labs on Admission: I have personally reviewed following labs and imaging studies  CBC: Recent Labs  Lab 04/09/20 1759 04/09/20 1831  WBC 8.4  --   HGB 11.6* 12.6*  HCT 35.3* 37.0*  MCV 85.5  --   PLT 549*  --    Basic Metabolic Panel: Recent Labs  Lab 04/09/20 1831  NA 140  K 4.2  CL 101  GLUCOSE 85  BUN 16  CREATININE 1.10   GFR: Estimated Creatinine Clearance: 57.8 mL/min (by C-G formula based on SCr of 1.1 mg/dL). Liver Function Tests: No results for input(s): AST, ALT, ALKPHOS, BILITOT, PROT, ALBUMIN in the last 168 hours. No results for input(s): LIPASE, AMYLASE in the last 168 hours. No results for input(s): AMMONIA in the last 168 hours. Coagulation Profile: No results for input(s): INR, PROTIME in the last 168 hours. Cardiac Enzymes: No results for input(s): CKTOTAL, CKMB, CKMBINDEX, TROPONINI in the last 168 hours. BNP (last 3 results) No results for input(s): PROBNP in the last 8760 hours. HbA1C: No results for input(s): HGBA1C in the last 72 hours. CBG: No results for input(s): GLUCAP in the last 168 hours. Lipid Profile: No results for input(s): CHOL, HDL, LDLCALC, TRIG, CHOLHDL, LDLDIRECT in the last 72 hours. Thyroid Function Tests: No results for input(s): TSH, T4TOTAL, FREET4, T3FREE, THYROIDAB in the last 72 hours. Anemia Panel: No results for input(s): VITAMINB12, FOLATE, FERRITIN, TIBC, IRON, RETICCTPCT in the last 72 hours. Urine  analysis:    Component Value Date/Time   COLORURINE AMBER BIOCHEMICALS MAY BE AFFECTED BY COLOR (A) 07/30/2009 2309   APPEARANCEUR CLEAR 07/30/2009 2309   LABSPEC >=1.030 01/05/2011 1654   PHURINE 6.0 01/05/2011 1654   GLUCOSEU NEGATIVE 01/05/2011 1654   HGBUR LARGE (A) 01/05/2011 1654   BILIRUBINUR NEGATIVE 01/05/2011 1654   KETONESUR NEGATIVE 01/05/2011 1654   PROTEINUR >=300 (A) 01/05/2011 1654   UROBILINOGEN 0.2 01/05/2011 1654   NITRITE NEGATIVE 01/05/2011 1654   LEUKOCYTESUR (A) 01/05/2011 1654    SMALL Biochemical Testing Only. Please order routine urinalysis from main lab if confirmatory testing is needed.   Sepsis Labs: @LABRCNTIP (procalcitonin:4,lacticidven:4) )No results found for this or any previous visit (from the past 240 hour(s)).   Radiological Exams on Admission: DG Chest 2 View  Result Date: 04/09/2020 CLINICAL DATA:  Chest pain. Additional provided: Shortness of breath and chest pain for 3-4 days, patient reports recent stroke and pneumonia, HIV, smoker, hypertension. EXAM: CHEST - 2 VIEW COMPARISON:  Prior chest radiographs 03/21/2012. FINDINGS: Heart  size within normal limits. There is abnormal opacity throughout much of the left lung field with associated left-sided volume loss and air lucency outlining the aortic knob. Findings likely reflect significant left upper and lower lobe atelectasis with hyperexpansion of the superior segment of the left lower lobe. Probable underlying airspace consolidation within the left lower lobe. The right lung is clear. No definite pleural effusion or evidence of pneumothorax. No acute bony abnormality identified. These results were called by telephone at the time of interpretation on 04/09/2020 at 6:05 pm to provider DAN FLOYD , who verbally acknowledged these results. IMPRESSION: Abnormal opacity throughout much of the left lung field with associated left-sided volume loss. Findings likely reflect significant partial collapse of the  left upper and lower lobes. Probable underlying airspace consolidation within the left lower lobe. Contrast-enhanced chest CT is recommended for further evaluation and to exclude a central obstructing mass. The right lung is clear. Electronically Signed   By: Jackey Loge DO   On: 04/09/2020 18:06   CT Angio Chest PE W and/or Wo Contrast  Result Date: 04/09/2020 CLINICAL DATA:  Shortness of breath and chest pain. Stroke and pneumonia. EXAM: CT ANGIOGRAPHY CHEST WITH CONTRAST TECHNIQUE: Multidetector CT imaging of the chest was performed using the standard protocol during bolus administration of intravenous contrast. Multiplanar CT image reconstructions and MIPs were obtained to evaluate the vascular anatomy. CONTRAST:  OMNIPAQUE IOHEXOL 350 MG/ML SOLN COMPARISON:  Chest radiography same day FINDINGS: Cardiovascular: Pulmonary arterial opacification is good. There are no pulmonary emboli. Mediastinum/Nodes: No mass or lymphadenopathy. Lungs/Pleura: On the left, there is a loculated pleural effusion in the posterior chest with associated compressive atelectasis of the posterior left lung. The non compressed lung appears clear. On the right, there is mild dependent atelectasis. The patient does have some emphysematous change in the lungs, more towards the inferior lungs than the superior lungs. Upper Abdomen: Negative Musculoskeletal: Normal.  Pectus configuration of the chest. Review of the MIP images confirms the above findings. IMPRESSION: No pulmonary emboli. Large loculated pleural fluid collection in the left posterior chest suggesting possible empyema. Compressive atelectasis of the posterior adjacent left lung. Aerated lung does not show obvious infiltrate. Emphysematous changes, basilar predominant. Emphysema (ICD10-J43.9). Electronically Signed   By: Paulina Fusi M.D.   On: 04/09/2020 20:09    EKG: Independently reviewed.  Normal sinus rhythm with LBBB.  Assessment/Plan Principal Problem:    Loculated pleural effusion Active Problems:   HIV DISEASE   Cerebral infarction (HCC)   Empyema (HCC)   Community acquired pneumonia    1. Pneumonia with complicated left pleural effusion loculated concerning for empyema for which cardiothoracic surgery was consulted recommended transfer to [facility].  Likely may need tracheostomy tube.  On empiric antibiotics.  Follow cultures check urine for Legionella strep antigen.  Covid test was negative. 2. History of HIV on antiretroviral which patient states he has been compliant and follows up with infectious disease in [place].  Check CD4 count with next blood draw. 3. History of stroke on Plavix presently holding Plavix for possible procedure.  Patient agrees to. 4. Tobacco abuse advised about quitting. 5. Chronic anemia follow CBC check anemia panel with next blood draw.  Since patient has a complicated effusion with pneumonia possible empyema will need close monitoring and further procedures and will need inpatient status.   DVT prophylaxis: SCDs for now.  Avoiding anticoagulation in anticipation of possible cardiothoracic procedure. Code Status: Full code. Family Communication: Discussed with patient. Disposition Plan:  Home. Consults called: Cardiothoracic surgery. Admission status: Inpatient.   Eduard Clos MD [facility] Pager [phone].  If 7PM-7AM, please contact night-coverage [email or web address] Password [facility]  04/09/2020, 10:24 PM

## 2020-04-10 ENCOUNTER — Other Ambulatory Visit (HOSPITAL_COMMUNITY): Payer: Self-pay

## 2020-04-10 ENCOUNTER — Other Ambulatory Visit: Payer: Self-pay

## 2020-04-10 LAB — CBC
HCT: 31.5 % — ABNORMAL LOW (ref 39.0–52.0)
Hemoglobin: 10.6 g/dL — ABNORMAL LOW (ref 13.0–17.0)
MCH: 28.2 pg (ref 26.0–34.0)
MCHC: 33.7 g/dL (ref 30.0–36.0)
MCV: 83.8 fL (ref 80.0–100.0)
Platelets: 548 10*3/uL — ABNORMAL HIGH (ref 150–400)
RBC: 3.76 MIL/uL — ABNORMAL LOW (ref 4.22–5.81)
RDW: 15.4 % (ref 11.5–15.5)
WBC: 9.1 10*3/uL (ref 4.0–10.5)
nRBC: 0 % (ref 0.0–0.2)

## 2020-04-10 LAB — BASIC METABOLIC PANEL
Anion gap: 8 (ref 5–15)
BUN: 18 mg/dL (ref 6–20)
CO2: 24 mmol/L (ref 22–32)
Calcium: 7.9 mg/dL — ABNORMAL LOW (ref 8.9–10.3)
Chloride: 104 mmol/L (ref 98–111)
Creatinine, Ser: 1.12 mg/dL (ref 0.61–1.24)
GFR calc Af Amer: >60 mL/min (ref >60)
GFR calc non Af Amer: >60 mL/min (ref >60)
Glucose, Bld: 98 mg/dL (ref 70–99)
Potassium: 5.9 mmol/L — ABNORMAL HIGH (ref 3.5–5.1)
Sodium: 136 mmol/L (ref 135–145)

## 2020-04-10 LAB — STREP PNEUMONIAE URINARY ANTIGEN: Strep Pneumo Urinary Antigen: POSITIVE — AB

## 2020-04-10 MED ORDER — OXYCODONE HCL 5 MG PO TABS
5.0000 mg | ORAL_TABLET | Freq: Four times a day (QID) | ORAL | Status: DC | PRN
  Administered 2020-04-10: 5 mg via ORAL
  Filled 2020-04-10: qty 1

## 2020-04-10 MED ORDER — SODIUM ZIRCONIUM CYCLOSILICATE 10 G PO PACK
10.0000 g | PACK | Freq: Once | ORAL | Status: AC
  Administered 2020-04-10: 10 g via ORAL
  Filled 2020-04-10: qty 1

## 2020-04-10 MED ORDER — MORPHINE SULFATE (PF) 2 MG/ML IV SOLN
1.0000 mg | INTRAVENOUS | Status: DC | PRN
  Administered 2020-04-10: 1 mg via INTRAVENOUS
  Filled 2020-04-10: qty 1

## 2020-04-10 MED ORDER — VANCOMYCIN HCL 500 MG/100ML IV SOLN
500.0000 mg | Freq: Two times a day (BID) | INTRAVENOUS | Status: DC
  Administered 2020-04-10 – 2020-04-12 (×6): 500 mg via INTRAVENOUS
  Filled 2020-04-10 (×7): qty 100

## 2020-04-10 MED ORDER — POLYETHYLENE GLYCOL 3350 17 G PO PACK: 17.0000 g | PACK | Freq: Every day | ORAL | Status: DC | PRN

## 2020-04-10 MED ORDER — PIPERACILLIN-TAZOBACTAM 3.375 G IVPB
3.3750 g | Freq: Three times a day (TID) | INTRAVENOUS | Status: AC
  Administered 2020-04-10 – 2020-04-15 (×16): 3.375 g via INTRAVENOUS
  Filled 2020-04-10 (×17): qty 50

## 2020-04-10 MED ORDER — OXYCODONE HCL 5 MG PO TABS
5.0000 mg | ORAL_TABLET | ORAL | Status: DC | PRN
  Administered 2020-04-10 – 2020-04-12 (×9): 5 mg via ORAL
  Filled 2020-04-10 (×9): qty 1

## 2020-04-10 MED ORDER — SENNOSIDES-DOCUSATE SODIUM 8.6-50 MG PO TABS: 2.0000 | ORAL_TABLET | Freq: Every evening | ORAL | Status: DC | PRN

## 2020-04-10 NOTE — Progress Notes (Signed)
Pharmacy Antibiotic Note  Eric Gardner is a 59 y.o. male admitted on 04/09/2020 with pneumonia.  Pharmacy has been consulted for vancomycin dosing.  Plan: Vancomycin 1250mg  iv x1, then Vancomycin 500 mg IV Q 12 hrs.  Nomogram vancomycin 50-60kg; 40-16mL/min Vancomycin trough goal 15-20 SCr used: 1.1  Azithromycin 500mg  iv q24hr Ceftriaxone 2gm iv q24hr     Height: 5\' 9"  (175.3 cm) Weight: 55.8 kg (123 lb) IBW/kg (Calculated) : 70.7  Temp (24hrs), Avg:97.9 F (36.6 C), Min:97.7 F (36.5 C), Max:98.1 F (36.7 C)  Recent Labs  Lab 04/09/20 1759 04/09/20 1831 04/10/20 0423  WBC 8.4  --  9.1  CREATININE  --  1.10  --     Estimated Creatinine Clearance: 57.8 mL/min (by C-G formula based on SCr of 1.1 mg/dL).    Allergies  Allergen Reactions  . Atorvastatin Other (See Comments)    Unknown    Antimicrobials this admission: Vancomycin 04/09/2020 >> Ceftriaxone 04/10/2020  >> Azithromycin 04/10/2020  >>   Dose adjustments this admission: -  Microbiology results: -  Thank you for allowing pharmacy to be a part of this patient's care.  04/11/2020 [place] 04/10/2020 4:37 AM

## 2020-04-10 NOTE — ED Notes (Signed)
This RN offered patient a hospital bed. Patient does not want a hospital bed at this time but will let this RN know if he changes his mind.   Patient resting comfortably.

## 2020-04-10 NOTE — ED Notes (Signed)
Report called to nurse Eber Jones at [facility]

## 2020-04-10 NOTE — ED Notes (Signed)
Patient given food tray and repositioned in bed.

## 2020-04-10 NOTE — ED Notes (Signed)
Stephania Fragmin MD paged to make aware for potassium-5.9.

## 2020-04-10 NOTE — ED Notes (Signed)
[facility] called for pt transfer to [facility]

## 2020-04-10 NOTE — ED Notes (Signed)
Patient repositioned in bed. Patient given meal tray. Patient feeding self.

## 2020-04-10 NOTE — Progress Notes (Signed)
Pharmacy Antibiotic Note  Eric Gardner is a 58 y.o. male admitted on 04/09/2020 with pneumonia.  Pharmacy has been consulted for vancomycin and Zosyn  dosing.  Plan: Vancomycin 1250mg  iv x1, then Vancomycin 500 mg IV Q 12 hrs.  Nomogram vancomycin 50-60kg; 40-12mL/min Vancomycin trough goal 15-20 SCr used: 1.1  Azithromycin 500mg  iv q24hr Zosyn 3.375 gr IV q8h extended infusion   Height: 5\' 9"  (175.3 cm) Weight: 55.8 kg (123 lb) IBW/kg (Calculated) : 70.7  Temp (24hrs), Avg:98.1 F (36.7 C), Min:97.7 F (36.5 C), Max:98.7 F (37.1 C)  Recent Labs  Lab 04/09/20 1759 04/09/20 1831 04/10/20 0423 04/10/20 0500  WBC 8.4  --  9.1  --   CREATININE  --  1.10  --  1.12    Estimated Creatinine Clearance: 56.7 mL/min (by C-G formula based on SCr of 1.12 mg/dL).    Allergies  Allergen Reactions  . Atorvastatin Other (See Comments)    Unknown    Antimicrobials this admission: Vancomycin 04/09/2020 >> Ceftriaxone 04/10/2020  >> 6/29 Azithromycin 04/10/2020  >> Zosyn 6/29 >>    Dose adjustments this admission: -  Microbiology results: -  Thank you for allowing pharmacy to be a part of this patient's care.   7/29, PharmD, BCPS 04/10/2020 8:34 AM

## 2020-04-10 NOTE — Progress Notes (Signed)
PROGRESS NOTE    Eric Gardner  VVO:[id number] DOB: November 13, 1960 DOA: 04/09/2020 PCP: Yisroel Ramming, MD   Brief Narrative:  59 y.o. male with history of HIV and previous stroke chronic anemia tobacco abuse was recently admitted in [place] about a week ago for pneumonia details of which are not available to [place] patient states he was in the hospital for 4 days was discharged home on antibiotics course of which patient has completed but has been having persistent left-sided chest pain on deep inspiration.  CT here showed left-sided loculated effusion concerning for empyema, CT surgery consulted who recommended transferring patient to [facility] for further evaluation.   Assessment & Plan:   Principal Problem:   Loculated pleural effusion Active Problems:   HIV DISEASE   Cerebral infarction (HCC)   Empyema (HCC)   Community acquired pneumonia  Acute respiratory distress secondary to left-sided effusion, loculated concerning for empyema HCAP -Patient denies any concerns for aspiration.  Unclear etiology what could have led to this.  Supplemental oxygen and bronchodilators as necessary.  Incentive spirometer.  Flutter valve.  Continue IV vancomycin, change Rocephin to Zosyn for anaerobic coverage.  CT surgery consulted, patient will require chest tube +/- VATS for decortication.   Hyperkalemia -Order EKG.  Lokelma 10 mg.  HIV on antiretroviral -Continue home meds-Biktarvy  History of CVA -Plavix currently on hold in anticipation for the procedure  Occasional tobacco use -Nicotine patch as needed.  Tells me his last cigarette was several weeks ago.  Anemia of chronic disease -Hemoglobin around baseline.  Continue to monitor.     DVT prophylaxis: Currently on hold until the procedure Code Status: Full code Family Communication: None  Status is: Inpatient  Remains inpatient appropriate because:Inpatient level of care appropriate due to severity of illness   Dispo:  The patient is from: Home              Anticipated d/c is to: Home              Anticipated d/c date is: 2 days              Patient currently is not medically stable to d/c.  Transferred to [facility] for evaluation by CT surgery   Body mass index is 18.16 kg/m.    Subjective: Seen and examined at bedside, no complaints besides pleuritic chest pain upon deep breaths.  Denies any shortness of breath at rest  Review of Systems Otherwise negative except as per HPI, including: General: Denies fever, chills, night sweats or unintended weight loss. Resp: Denies cough, wheezing, shortness of breath. Cardiac: Denies chest pain, palpitations, orthopnea, paroxysmal nocturnal dyspnea. GI: Denies abdominal pain, nausea, vomiting, diarrhea or constipation GU: Denies dysuria, frequency, hesitancy or incontinence MS: Denies muscle aches, joint pain or swelling Neuro: Denies headache, neurologic deficits (focal weakness, numbness, tingling), abnormal gait Psych: Denies anxiety, depression, SI/HI/AVH Skin: Denies new rashes or lesions ID: Denies sick contacts, exotic exposures, travel  Examination:  General exam: Appears calm and comfortable  Respiratory system: Left lower base diminished breath sounds Cardiovascular system: S1 & S2 heard, RRR. No JVD, murmurs, rubs, gallops or clicks. No pedal edema. Gastrointestinal system: Abdomen is nondistended, soft and nontender. No organomegaly or masses felt. Normal bowel sounds heard. Central nervous system: Alert and oriented. No focal neurological deficits. Extremities: Symmetric 5 x 5 power. Skin: No rashes, lesions or ulcers Psychiatry: Judgement and insight appear normal. Mood & affect appropriate.     Objective: Vitals:  04/10/20 [id number] 04/10/20 0919 04/10/20 0928 04/10/20 0958  BP: 121/76 109/62 116/71 114/69  Pulse: 86 79 83 79  Resp: 16 14 (!) 28 19  Temp: 97.6 F (36.4 C)     TempSrc: Oral     SpO2: 97% 96% 97% 97%    Weight:      Height:        Intake/Output Summary (Last 24 hours) at 04/10/2020 1045 Last data filed at 04/10/2020 0853 Gross per 24 hour  Intake 1115.55 ml  Output 550 ml  Net 565.55 ml   Filed Weights   04/09/20 1712 04/09/20 1957  Weight: 55.8 kg 55.8 kg     Data Reviewed:   CBC: Recent Labs  Lab 04/09/20 1759 04/09/20 1831 04/10/20 0423  WBC 8.4  --  9.1  HGB 11.6* 12.6* 10.6*  HCT 35.3* 37.0* 31.5*  MCV 85.5  --  83.8  PLT 549*  --  548*   Basic Metabolic Panel: Recent Labs  Lab 04/09/20 1831 04/10/20 0500  NA 140 136  K 4.2 5.9*  CL 101 104  CO2  --  24  GLUCOSE 85 98  BUN 16 18  CREATININE 1.10 1.12  CALCIUM  --  7.9*   GFR: Estimated Creatinine Clearance: 56.7 mL/min (by C-G formula based on SCr of 1.12 mg/dL). Liver Function Tests: No results for input(s): AST, ALT, ALKPHOS, BILITOT, PROT, ALBUMIN in the last 168 hours. No results for input(s): LIPASE, AMYLASE in the last 168 hours. No results for input(s): AMMONIA in the last 168 hours. Coagulation Profile: No results for input(s): INR, PROTIME in the last 168 hours. Cardiac Enzymes: No results for input(s): CKTOTAL, CKMB, CKMBINDEX, TROPONINI in the last 168 hours. BNP (last 3 results) No results for input(s): PROBNP in the last 8760 hours. HbA1C: No results for input(s): HGBA1C in the last 72 hours. CBG: No results for input(s): GLUCAP in the last 168 hours. Lipid Profile: No results for input(s): CHOL, HDL, LDLCALC, TRIG, CHOLHDL, LDLDIRECT in the last 72 hours. Thyroid Function Tests: No results for input(s): TSH, T4TOTAL, FREET4, T3FREE, THYROIDAB in the last 72 hours. Anemia Panel: No results for input(s): VITAMINB12, FOLATE, FERRITIN, TIBC, IRON, RETICCTPCT in the last 72 hours. Sepsis Labs: No results for input(s): PROCALCITON, LATICACIDVEN in the last 168 hours.  Recent Results (from the past 240 hour(s))  SARS Coronavirus 2 by RT PCR (hospital order, performed in [facility]  hospital lab) Nasopharyngeal Nasopharyngeal Swab     Status: None   Collection Time: 04/09/20  9:38 PM   Specimen: Nasopharyngeal Swab  Result Value Ref Range Status   SARS Coronavirus 2 NEGATIVE NEGATIVE Final    Comment: (NOTE) SARS-CoV-2 target nucleic acids are NOT DETECTED.  The SARS-CoV-2 RNA is generally detectable in upper and lower respiratory specimens during the acute phase of infection. The lowest concentration of SARS-CoV-2 viral copies this assay can detect is 250 copies / mL. A negative result does not preclude SARS-CoV-2 infection and should not be used as the sole basis for treatment or other patient management decisions.  A negative result may occur with improper specimen collection / handling, submission of specimen other than nasopharyngeal swab, presence of viral mutation(s) within the areas targeted by this assay, and inadequate number of viral copies (<250 copies / mL). A negative result must be combined with clinical observations, patient history, and epidemiological information.  Fact Sheet for Patients:   BoilerBrush.com.cyhttps://www.fda.gov/media/136312/download  Fact Sheet for Healthcare Providers: https://pope.com/https://www.fda.gov/media/136313/download  This test is not yet  approved or  cleared by the [place] and has been authorized for detection and/or diagnosis of SARS-CoV-2 by FDA under an Emergency Use Authorization (EUA).  This EUA will remain in effect (meaning this test can be used) for the duration of the COVID-19 declaration under Section 564(b)(1) of the Act, 21 U.S.C. section 360bbb-3(b)(1), unless the authorization is terminated or revoked sooner.  Performed at [facility], [address], [place], [place] [address]   Culture, blood (Routine X 2) w Reflex to ID Panel     Status: None (Preliminary result)   Collection Time: 04/09/20 11:50 PM   Specimen: BLOOD LEFT FOREARM  Result Value Ref Range Status   Specimen Description   Final     BLOOD LEFT FOREARM Performed at [facility], [address] [place], [place] [address]    Special Requests   Final    BOTTLES DRAWN AEROBIC AND ANAEROBIC Blood Culture adequate volume Performed at [facility], [address] [place], [place] [address]    Culture   Final    NO GROWTH < 12 HOURS Performed at [facility], [address], [place], [place] [address]    Report Status PENDING  Incomplete  Culture, blood (Routine X 2) w Reflex to ID Panel     Status: None (Preliminary result)   Collection Time: 04/09/20 11:50 PM   Specimen: BLOOD  Result Value Ref Range Status   Specimen Description   Final    BLOOD LEFT UPPER ARM Performed at [facility], [address], [place], [place] [address]    Special Requests   Final    BOTTLES DRAWN AEROBIC AND ANAEROBIC Blood Culture results may not be optimal due to an excessive volume of blood received in culture bottles Performed at [facility], [address], [place], [place] [address]    Culture   Final    NO GROWTH < 12 HOURS Performed at [facility], [address], [place], [place] [address]    Report Status PENDING  Incomplete         Radiology Studies: DG Chest 2 View  Result Date: 04/09/2020 CLINICAL DATA:  Chest pain. Additional provided: Shortness of breath and chest pain for 3-4 days, patient reports recent stroke and pneumonia, HIV, smoker, hypertension. EXAM: CHEST - 2 VIEW COMPARISON:  Prior chest radiographs 03/21/2012. FINDINGS: Heart size within normal limits. There is abnormal opacity throughout much of the left lung field with associated left-sided volume loss and air lucency outlining the aortic knob. Findings likely reflect significant left upper and lower lobe atelectasis with hyperexpansion of the superior segment of the left lower lobe. Probable underlying airspace consolidation within the left lower lobe. The right lung is clear. No definite  pleural effusion or evidence of pneumothorax. No acute bony abnormality identified. These results were called by telephone at the time of interpretation on 04/09/2020 at 6:05 pm to provider DAN FLOYD , who verbally acknowledged these results. IMPRESSION: Abnormal opacity throughout much of the left lung field with associated left-sided volume loss. Findings likely reflect significant partial collapse of the left upper and lower lobes. Probable underlying airspace consolidation within the left lower lobe. Contrast-enhanced chest CT is recommended for further evaluation and to exclude a central obstructing mass. The right lung is clear. Electronically Signed   By: Jackey Loge DO   On: 04/09/2020 18:06   CT Angio Chest PE W and/or Wo Contrast  Result Date: 04/09/2020 CLINICAL DATA:  Shortness of breath and chest pain. Stroke and pneumonia. EXAM: CT ANGIOGRAPHY CHEST WITH CONTRAST TECHNIQUE: Multidetector CT imaging of the chest was performed using the standard protocol during bolus administration of intravenous contrast. Multiplanar CT image reconstructions and MIPs were obtained to evaluate the vascular anatomy. CONTRAST:  OMNIPAQUE IOHEXOL 350 MG/ML SOLN COMPARISON:  Chest radiography same day FINDINGS: Cardiovascular: Pulmonary arterial opacification is good. There are no pulmonary emboli. Mediastinum/Nodes: No mass or lymphadenopathy. Lungs/Pleura: On the left, there is a loculated pleural effusion in the posterior chest with associated compressive atelectasis of the posterior left lung. The non compressed lung appears clear. On the right, there is mild dependent atelectasis. The patient does have some emphysematous change in the lungs, more towards the inferior lungs than the superior lungs. Upper Abdomen: Negative Musculoskeletal: Normal.  Pectus configuration of the chest. Review of the MIP images confirms the above findings. IMPRESSION: No pulmonary emboli. Large loculated pleural fluid collection in  the left posterior chest suggesting possible empyema. Compressive atelectasis of the posterior adjacent left lung. Aerated lung does not show obvious infiltrate. Emphysematous changes, basilar predominant. Emphysema (ICD10-J43.9). Electronically Signed   By: Paulina Fusi M.D.   On: 04/09/2020 20:09        Scheduled Meds: . bictegravir-emtricitabine-tenofovir AF  1 tablet Oral Daily   Continuous Infusions: . azithromycin Stopped (04/10/20 0106)  . piperacillin-tazobactam (ZOSYN)  IV    . vancomycin 500 mg (04/10/20 1018)     LOS: 1 day   Time spent= 35 mins    Zeynab Klett Joline Maxcy, MD [facility]  If 7PM-7AM, please contact night-coverage  04/10/2020, 10:45 AM

## 2020-04-10 NOTE — ED Notes (Signed)
[facility] here for pt transfer to [facility]

## 2020-04-11 ENCOUNTER — Inpatient Hospital Stay (HOSPITAL_COMMUNITY): Payer: Medicare Other

## 2020-04-11 ENCOUNTER — Encounter (HOSPITAL_COMMUNITY): Payer: Self-pay | Admitting: Anesthesiology

## 2020-04-11 DIAGNOSIS — B20 Human immunodeficiency virus [HIV] disease: Secondary | ICD-10-CM

## 2020-04-11 DIAGNOSIS — J869 Pyothorax without fistula: Secondary | ICD-10-CM

## 2020-04-11 LAB — LEGIONELLA PNEUMOPHILA SEROGP 1 UR AG: L. pneumophila Serogp 1 Ur Ag: NEGATIVE

## 2020-04-11 MED ORDER — FENTANYL CITRATE (PF) 100 MCG/2ML IJ SOLN
INTRAMUSCULAR | Status: AC | PRN
  Administered 2020-04-11 (×2): 50 ug via INTRAVENOUS

## 2020-04-11 MED ORDER — MIDAZOLAM HCL 2 MG/2ML IJ SOLN
INTRAMUSCULAR | Status: AC | PRN
  Administered 2020-04-11 (×2): 1 mg via INTRAVENOUS

## 2020-04-11 MED ORDER — MIDAZOLAM HCL 2 MG/2ML IJ SOLN
INTRAMUSCULAR | Status: AC
  Filled 2020-04-11: qty 4

## 2020-04-11 MED ORDER — FENTANYL CITRATE (PF) 100 MCG/2ML IJ SOLN
INTRAMUSCULAR | Status: AC
  Filled 2020-04-11: qty 4

## 2020-04-11 NOTE — Plan of Care (Signed)

## 2020-04-11 NOTE — H&P (Signed)
Chief Complaint: Patient was seen in consultation today for  Chief Complaint  Patient presents with  . Shortness of Breath  . Chest Pain    Referring Physician(s): Lowella Dandy, PA-C  Supervising Physician: Simonne Come  Patient Status: [facility] - In-pt  History of Present Illness: Eric Gardner is a 59 y.o. male with a medical history that includes HTN, HIV, chronic anemia, stroke (Plavix) and a recent diagnosis of left-sided pneumonia for which he completed a round of antibiotics.  He presented to the ED 04/09/20 with shortness of breath and chest pain.    CT angio chest 04/09/20:  1. No pulmonary emboli. 2. Large loculated pleural fluid collection in the left posterior chest suggesting possible empyema. Compressive atelectasis of the posterior adjacent left lung. Aerated lung does not show obvious infiltrate. 3. Emphysematous changes, basilar predominant.  Cardiothoracic Surgery has consulted on this patient and have recommended that IR place a left chest tube for further management.   Past Medical History:  Diagnosis Date  . HIV (human immunodeficiency virus infection) (HCC)   . Hypertension     History reviewed. No pertinent surgical history.  Allergies: Atorvastatin  Medications: Prior to Admission medications   Medication Sig Start Date End Date Taking? Authorizing Provider  BIKTARVY 50-200-25 MG TABS tablet Take 1 tablet by mouth daily. 03/22/20  Yes [provider]  clopidogrel (PLAVIX) 75 MG tablet Take 75 mg by mouth daily. 03/27/20  Yes [provider]  gabapentin (NEURONTIN) 300 MG capsule Take 300 mg by mouth daily as needed (nerve pain).  03/27/20  Yes [provider]  Multiple Vitamins-Minerals (THERA-TABS M) TABS Take 1 tablet by mouth daily. 10/17/19  Yes [provider]  Vitamin D, Ergocalciferol, (DRISDOL) 1.25 MG (50000 UNIT) CAPS capsule Take 50,000 Units by mouth 2 (two) times a week. 03/22/20  Yes [provider]  diclofenac Sodium (VOLTAREN) 1 % GEL Apply topically. Patient not taking: Reported on 04/09/2020 03/26/20   [provider]  efavirenz-emtrictabine-tenofovir (ATRIPLA) 600-200-300 MG per tablet Take 1 tablet by mouth at bedtime. Patient not taking: Reported on 04/09/2020    [provider]  hydrochlorothiazide (HYDRODIURIL) 25 MG tablet Take 25 mg by mouth every morning. Patient not taking: Reported on 04/09/2020 02/23/20   [provider]  hydrOXYzine (VISTARIL) 25 MG capsule Take 25 mg by mouth 2 (two) times daily as needed. Patient not taking: Reported on 04/09/2020 02/23/20   [provider]  levofloxacin (LEVAQUIN) 750 MG tablet Take 750 mg by mouth daily. Patient not taking: Reported on 04/09/2020 04/04/20   [provider]  meloxicam (MOBIC) 7.5 MG tablet Take 7.5 mg by mouth 2 (two) times daily as needed. Patient not taking: Reported on 04/09/2020 03/21/20   [provider]     Family History  Problem Relation Age of Onset  . Diabetes Mellitus II Father   . Stroke Neg Hx     Social History   Socioeconomic History  . Marital status: Single    Spouse name: Not on file  . Number of children: Not on file  . Years of education: Not on file  . Highest education level: Not on file  Occupational History  . Not on file  Tobacco Use  . Smoking status: Current Every Day Smoker  . Smokeless tobacco: Never Used  Vaping Use  . Vaping Use: Never used  Substance and Sexual Activity  . Alcohol use: No  . Drug use: No  . Sexual activity: Not  on file  Other Topics Concern  . Not on file  Social History Narrative  . Not on file   Social Determinants of Health   Financial Resource Strain:   . Difficulty of Paying Living Expenses:   Food Insecurity:   . Worried About Programme researcher, broadcasting/film/videounning Out of Food in the Last Year:   . Baristaan Out of Food in the Last Year:   Transportation Needs:   . Freight forwarderLack of Transportation (Medical):   Marland Kitchen. Lack of Transportation  (Non-Medical):   Physical Activity:   . Days of Exercise per Week:   . Minutes of Exercise per Session:   Stress:   . Feeling of Stress :   Social Connections:   . Frequency of Communication with Friends and Family:   . Frequency of Social Gatherings with Friends and Family:   . Attends Religious Services:   . Active Member of Clubs or Organizations:   . Attends BankerClub or Organization Meetings:   Marland Kitchen. Marital Status:     Review of Systems: A 12 point ROS discussed and pertinent positives are indicated in the HPI above.  All other systems are negative.  Review of Systems  Constitutional: Positive for activity change, appetite change and fatigue.  Respiratory: Positive for shortness of breath. Negative for cough.        Positive for left-sided back/chest pain.   Cardiovascular: Negative for leg swelling.  Gastrointestinal: Negative for abdominal distention, abdominal pain, constipation, diarrhea, nausea and vomiting.  Musculoskeletal: Positive for back pain.       Left-side  Neurological: Negative for headaches.    Vital Signs: BP 112/68 (BP Location: Right Arm)   Pulse 82   Temp 98.8 F (37.1 C)   Resp 15   Ht 5\' 9"  (1.753 m)   Wt 123 lb (55.8 kg)   SpO2 95%   BMI 18.16 kg/m   Physical Exam Constitutional:      Appearance: He is ill-appearing.  HENT:     Mouth/Throat:     Mouth: Mucous membranes are moist.  Cardiovascular:     Rate and Rhythm: Normal rate and regular rhythm.  Pulmonary:     Effort: Pulmonary effort is normal.     Breath sounds: Decreased breath sounds present.  Chest:     Chest wall: Tenderness present.  Abdominal:     General: Bowel sounds are normal.     Palpations: Abdomen is soft.  Musculoskeletal:        General: Normal range of motion.  Skin:    General: Skin is warm and dry.  Neurological:     Mental Status: He is alert and oriented to person, place, and time.     Imaging: DG Chest 2 View  Result Date: 04/09/2020 CLINICAL DATA:   Chest pain. Additional provided: Shortness of breath and chest pain for 3-4 days, patient reports recent stroke and pneumonia, HIV, smoker, hypertension. EXAM: CHEST - 2 VIEW COMPARISON:  Prior chest radiographs 03/21/2012. FINDINGS: Heart size within normal limits. There is abnormal opacity throughout much of the left lung field with associated left-sided volume loss and air lucency outlining the aortic knob. Findings likely reflect significant left upper and lower lobe atelectasis with hyperexpansion of the superior segment of the left lower lobe. Probable underlying airspace consolidation within the left lower lobe. The right lung is clear. No definite pleural effusion or evidence of pneumothorax. No acute bony abnormality identified. These results were called by telephone at the time of interpretation on 04/09/2020 at 6:05 pm to  provider DAN FLOYD , who verbally acknowledged these results. IMPRESSION: Abnormal opacity throughout much of the left lung field with associated left-sided volume loss. Findings likely reflect significant partial collapse of the left upper and lower lobes. Probable underlying airspace consolidation within the left lower lobe. Contrast-enhanced chest CT is recommended for further evaluation and to exclude a central obstructing mass. The right lung is clear. Electronically Signed   By: Jackey Loge DO   On: 04/09/2020 18:06   CT Angio Chest PE W and/or Wo Contrast  Result Date: 04/09/2020 CLINICAL DATA:  Shortness of breath and chest pain. Stroke and pneumonia. EXAM: CT ANGIOGRAPHY CHEST WITH CONTRAST TECHNIQUE: Multidetector CT imaging of the chest was performed using the standard protocol during bolus administration of intravenous contrast. Multiplanar CT image reconstructions and MIPs were obtained to evaluate the vascular anatomy. CONTRAST:  OMNIPAQUE IOHEXOL 350 MG/ML SOLN COMPARISON:  Chest radiography same day FINDINGS: Cardiovascular: Pulmonary arterial opacification is  good. There are no pulmonary emboli. Mediastinum/Nodes: No mass or lymphadenopathy. Lungs/Pleura: On the left, there is a loculated pleural effusion in the posterior chest with associated compressive atelectasis of the posterior left lung. The non compressed lung appears clear. On the right, there is mild dependent atelectasis. The patient does have some emphysematous change in the lungs, more towards the inferior lungs than the superior lungs. Upper Abdomen: Negative Musculoskeletal: Normal.  Pectus configuration of the chest. Review of the MIP images confirms the above findings. IMPRESSION: No pulmonary emboli. Large loculated pleural fluid collection in the left posterior chest suggesting possible empyema. Compressive atelectasis of the posterior adjacent left lung. Aerated lung does not show obvious infiltrate. Emphysematous changes, basilar predominant. Emphysema (ICD10-J43.9). Electronically Signed   By: Paulina Fusi M.D.   On: 04/09/2020 20:09    Labs:  CBC: Recent Labs    04/09/20 1759 04/09/20 1831 04/10/20 0423  WBC 8.4  --  9.1  HGB 11.6* 12.6* 10.6*  HCT 35.3* 37.0* 31.5*  PLT 549*  --  548*    COAGS: No results for input(s): INR, APTT in the last 8760 hours.  BMP: Recent Labs    04/09/20 1831 04/10/20 0500  NA 140 136  K 4.2 5.9*  CL 101 104  CO2  --  24  GLUCOSE 85 98  BUN 16 18  CALCIUM  --  7.9*  CREATININE 1.10 1.12  GFRNONAA  --  >60  GFRAA  --  >60    LIVER FUNCTION TESTS: No results for input(s): BILITOT, AST, ALT, ALKPHOS, PROT, ALBUMIN in the last 8760 hours.  TUMOR MARKERS: No results for input(s): AFPTM, CEA, CA199, CHROMGRNA in the last 8760 hours.  Assessment and Plan:  Loculated left pleural fluid collection: Eric Gardner, 59 year old male, is scheduled to have a left chest tube placed today at the [facility] Interventional Radiology department. This case has been approved by  Dr. Grace Isaac.  Labs and vitals have been reviewed. Negative COVID  test on 04/09/20. Patient has been NPO since 0800. He is not on any blood thinning medications.   Risks and benefits discussed with the patient including bleeding, infection, damage to adjacent structures, malfunction of the tube with need for additional procedures.  All of the patient's questions were answered, patient is agreeable to proceed.  Consent signed and in chart.  Thank you for this interesting consult.  I greatly enjoyed meeting Eric Gardner and look forward to participating in their care.  A copy of this report was sent to  the requesting provider on this date.  Electronically Signed: Alwyn Ren, AGACNP-BC [phone] 04/11/2020, 11:06 AM   I spent a total of 20 Minutes  in face to face in clinical consultation, greater than 50% of which was counseling/coordinating care for left chest tube.

## 2020-04-11 NOTE — Procedures (Signed)
Pre procedural Dx: Left sided pleural effusion Post procedural Dx: Same  Technically successful CT guided placed of a 12 Fr drainage catheter placement into the left pleural space yielding 250 cc of serous fluid..    A representative aspirated sample was capped and sent to the laboratory for analysis.    EBL: Trace Complications: None immediate  Katherina Right, MD Pager #: [phone]

## 2020-04-11 NOTE — Progress Notes (Signed)
[facility] PROGRESS NOTE    Progress Note  Eric SheffieldMark A Nickle  ZOX:[id number] DOB: 17-Dec-1960 DOA: 04/09/2020 PCP: Yisroel RammingVollmer, Kelly, MD     Brief Narrative:   Eric Gardner is an 59 y.o. male past medical history of HIV, previous stroke, chronic anemia [facility] a [facility] prior to admission for pneumonia which details are not available.  As per patient he was in the hospital 4 days and discharged on empiric antibiotics, following his discharge he started having left-sided chest pain which is persistent and worse with inspiration.  CT of the chest shows a loculated effusion concerning for empyema, CT surgery was consulted  Assessment/Plan:   Acute respiratory distress secondary to left-sided empyema due to healthcare associated pneumonia: He was started on supplemental oxygen and bronchodilators accompanied by incentive spirometry and flutter valve.  Started empirically on IV vancomycin and Zosyn for anaerobic coverage. CT surgery was consulted for possible chest tube plus or minus VATS for decortication.  HyperKalemia: He was given Lokelma, basic metabolic panel is pending this morning.  HIV on antiretroviral therapy: Continue heart therapy.  History of CVA: Holding Plavix in anticipation of procedure.  Ongoing tobacco abuse: Nicotine patch.  Anemia of chronic disease: Hemoglobin seems to be close to baseline.   DVT prophylaxis: lovenox Family Communication:none Status is: Inpatient  Remains inpatient appropriate because:Hemodynamically unstable   Dispo: The patient is from: Home              Anticipated d/c is to: Home              Anticipated d/c date is: 2 days              Patient currently is medically stable to d/c.        Code Status:     Code Status Orders  (From admission, onward)         Start     Ordered   04/09/20 2222  Full code  Continuous        04/09/20 2222        Code Status History    This patient has a current  code status but no historical code status.   Advance Care Planning Activity        IV Access:    Peripheral IV   Procedures and diagnostic studies:   DG Chest 2 View  Result Date: 04/09/2020 CLINICAL DATA:  Chest pain. Additional provided: Shortness of breath and chest pain for 3-4 days, patient reports recent stroke and pneumonia, HIV, smoker, hypertension. EXAM: CHEST - 2 VIEW COMPARISON:  Prior chest radiographs 03/21/2012. FINDINGS: Heart size within normal limits. There is abnormal opacity throughout much of the left lung field with associated left-sided volume loss and air lucency outlining the aortic knob. Findings likely reflect significant left upper and lower lobe atelectasis with hyperexpansion of the superior segment of the left lower lobe. Probable underlying airspace consolidation within the left lower lobe. The right lung is clear. No definite pleural effusion or evidence of pneumothorax. No acute bony abnormality identified. These results were called by telephone at the time of interpretation on 04/09/2020 at 6:05 pm to provider DAN FLOYD , who verbally acknowledged these results. IMPRESSION: Abnormal opacity throughout much of the left lung field with associated left-sided volume loss. Findings likely reflect significant partial collapse of the left upper and lower lobes. Probable underlying airspace consolidation within the left lower lobe. Contrast-enhanced chest CT is recommended for further evaluation and to exclude a  central obstructing mass. The right lung is clear. Electronically Signed   By: Jackey Loge DO   On: 04/09/2020 18:06   CT Angio Chest PE W and/or Wo Contrast  Result Date: 04/09/2020 CLINICAL DATA:  Shortness of breath and chest pain. Stroke and pneumonia. EXAM: CT ANGIOGRAPHY CHEST WITH CONTRAST TECHNIQUE: Multidetector CT imaging of the chest was performed using the standard protocol during bolus administration of intravenous contrast. Multiplanar CT image  reconstructions and MIPs were obtained to evaluate the vascular anatomy. CONTRAST:  OMNIPAQUE IOHEXOL 350 MG/ML SOLN COMPARISON:  Chest radiography same day FINDINGS: Cardiovascular: Pulmonary arterial opacification is good. There are no pulmonary emboli. Mediastinum/Nodes: No mass or lymphadenopathy. Lungs/Pleura: On the left, there is a loculated pleural effusion in the posterior chest with associated compressive atelectasis of the posterior left lung. The non compressed lung appears clear. On the right, there is mild dependent atelectasis. The patient does have some emphysematous change in the lungs, more towards the inferior lungs than the superior lungs. Upper Abdomen: Negative Musculoskeletal: Normal.  Pectus configuration of the chest. Review of the MIP images confirms the above findings. IMPRESSION: No pulmonary emboli. Large loculated pleural fluid collection in the left posterior chest suggesting possible empyema. Compressive atelectasis of the posterior adjacent left lung. Aerated lung does not show obvious infiltrate. Emphysematous changes, basilar predominant. Emphysema (ICD10-J43.9). Electronically Signed   By: Paulina Fusi M.D.   On: 04/09/2020 20:09     Medical Consultants:    None.  Anti-Infectives:   IV vancomycin, Zosyn and azithromycin  Subjective:    Eric Gardner he relates he is having no further fevers poor appetite pain is controlled.  Objective:    Vitals:   04/10/20 1833 04/10/20 2312 04/11/20 0721 04/11/20 0759  BP: (!) 147/84 121/81 116/75 112/68  Pulse: 91 92 86 82  Resp: 18 20 19 15   Temp: 99.7 F (37.6 C) 99.4 F (37.4 C) 99.1 F (37.3 C) 98.8 F (37.1 C)  TempSrc: Oral  Oral   SpO2: 96% 97% 96% 95%  Weight:      Height:       SpO2: 95 %   Intake/Output Summary (Last 24 hours) at 04/11/2020 0913 Last data filed at 04/11/2020 0450 Gross per 24 hour  Intake 657.86 ml  Output 350 ml  Net 307.86 ml   Filed Weights   04/09/20 1712  04/09/20 1957  Weight: 55.8 kg 55.8 kg    Exam: General exam: In no acute distress. Respiratory system: Good air movement and clear to auscultation. Cardiovascular system: S1 & S2 heard, RRR. No JVD. Gastrointestinal system: Abdomen is nondistended, soft and nontender.  Extremities: No pedal edema. Skin: No rashes, lesions or ulcers Psychiatry: Judgement and insight appear normal. Mood & affect appropriate.    Data Reviewed:    Labs: Basic Metabolic Panel: Recent Labs  Lab 04/09/20 1831 04/10/20 0500  NA 140 136  K 4.2 5.9*  CL 101 104  CO2  --  24  GLUCOSE 85 98  BUN 16 18  CREATININE 1.10 1.12  CALCIUM  --  7.9*   GFR Estimated Creatinine Clearance: 56.7 mL/min (by C-G formula based on SCr of 1.12 mg/dL). Liver Function Tests: No results for input(s): AST, ALT, ALKPHOS, BILITOT, PROT, ALBUMIN in the last 168 hours. No results for input(s): LIPASE, AMYLASE in the last 168 hours. No results for input(s): AMMONIA in the last 168 hours. Coagulation profile No results for input(s): INR, PROTIME in the last 168 hours. COVID-19  Labs  No results for input(s): DDIMER, FERRITIN, LDH, CRP in the last 72 hours.  Lab Results  Component Value Date   SARSCOV2NAA NEGATIVE 04/09/2020    CBC: Recent Labs  Lab 04/09/20 1759 04/09/20 1831 04/10/20 0423  WBC 8.4  --  9.1  HGB 11.6* 12.6* 10.6*  HCT 35.3* 37.0* 31.5*  MCV 85.5  --  83.8  PLT 549*  --  548*   Cardiac Enzymes: No results for input(s): CKTOTAL, CKMB, CKMBINDEX, TROPONINI in the last 168 hours. BNP (last 3 results) No results for input(s): PROBNP in the last 8760 hours. CBG: No results for input(s): GLUCAP in the last 168 hours. D-Dimer: No results for input(s): DDIMER in the last 72 hours. Hgb A1c: No results for input(s): HGBA1C in the last 72 hours. Lipid Profile: No results for input(s): CHOL, HDL, LDLCALC, TRIG, CHOLHDL, LDLDIRECT in the last 72 hours. Thyroid function studies: No results for  input(s): TSH, T4TOTAL, T3FREE, THYROIDAB in the last 72 hours.  Invalid input(s): FREET3 Anemia work up: No results for input(s): VITAMINB12, FOLATE, FERRITIN, TIBC, IRON, RETICCTPCT in the last 72 hours. Sepsis Labs: Recent Labs  Lab 04/09/20 1759 04/10/20 0423  WBC 8.4 9.1   Microbiology Recent Results (from the past 240 hour(s))  SARS Coronavirus 2 by RT PCR (hospital order, performed in [facility] hospital lab) Nasopharyngeal Nasopharyngeal Swab     Status: None   Collection Time: 04/09/20  9:38 PM   Specimen: Nasopharyngeal Swab  Result Value Ref Range Status   SARS Coronavirus 2 NEGATIVE NEGATIVE Final    Comment: (NOTE) SARS-CoV-2 target nucleic acids are NOT DETECTED.  The SARS-CoV-2 RNA is generally detectable in upper and lower respiratory specimens during the acute phase of infection. The lowest concentration of SARS-CoV-2 viral copies this assay can detect is 250 copies / mL. A negative result does not preclude SARS-CoV-2 infection and should not be used as the sole basis for treatment or other patient management decisions.  A negative result may occur with improper specimen collection / handling, submission of specimen other than nasopharyngeal swab, presence of viral mutation(s) within the areas targeted by this assay, and inadequate number of viral copies (<250 copies / mL). A negative result must be combined with clinical observations, patient history, and epidemiological information.  Fact Sheet for Patients:   BoilerBrush.com.cy  Fact Sheet for Healthcare Providers: https://pope.com/  This test is not yet approved or  cleared by the [place] FDA and has been authorized for detection and/or diagnosis of SARS-CoV-2 by FDA under an Emergency Use Authorization (EUA).  This EUA will remain in effect (meaning this test can be used) for the duration of the COVID-19 declaration under Section 564(b)(1) of the  Act, 21 U.S.C. section 360bbb-3(b)(1), unless the authorization is terminated or revoked sooner.  Performed at [facility], [address], [place], [place] [address]   Culture, blood (Routine X 2) w Reflex to ID Panel     Status: None (Preliminary result)   Collection Time: 04/09/20 11:50 PM   Specimen: BLOOD LEFT FOREARM  Result Value Ref Range Status   Specimen Description   Final    BLOOD LEFT FOREARM Performed at [facility], [address] [place], [place] [address]    Special Requests   Final    BOTTLES DRAWN AEROBIC AND ANAEROBIC Blood Culture adequate volume Performed at [facility], [address] [place], [place] [address]    Culture   Final  NO GROWTH 1 DAY Performed at [facility], [address], [place], [place] [address]    Report Status PENDING  Incomplete  Culture, blood (Routine X 2) w Reflex to ID Panel     Status: None (Preliminary result)   Collection Time: 04/09/20 11:50 PM   Specimen: BLOOD  Result Value Ref Range Status   Specimen Description   Final    BLOOD LEFT UPPER ARM Performed at [facility], [address], [place], [place] [address]    Special Requests   Final    BOTTLES DRAWN AEROBIC AND ANAEROBIC Blood Culture results may not be optimal due to an excessive volume of blood received in culture bottles Performed at [facility], [address], [place], [place] [address]    Culture   Final    NO GROWTH 1 DAY Performed at [facility], [address] [place], [place] [address]    Report Status PENDING  Incomplete     Medications:    bictegravir-emtricitabine-tenofovir AF  1 tablet Oral Daily   Continuous Infusions:  azithromycin 500 mg (04/10/20 2120)   piperacillin-tazobactam (ZOSYN)  IV 3.375 g (04/11/20 0752)   vancomycin 500 mg (04/11/20 0751)      LOS: 2 days   Marinda Elk  [facility]  04/11/2020, 9:13 AM

## 2020-04-11 NOTE — Consult Note (Addendum)
[address]       [place] [address]             [phone]        Eric Gardner [facility] Medical Record #[id number] Date of Birth: [birth year]  Referring: Dr. Marinda Elk Primary Care: Yisroel Ramming, MD Primary Cardiologist:No primary care provider on file.  Chief Complaint:    Chief Complaint  Patient presents with  . Shortness of Breath  . Chest Pain   History of Present Illness:      Eric Gardner is a 59 yo male with history of HIV, previous stroke which he takes plavix, chronic anemia, and recent pneumonia.  The patient states that he is fairly active working out 3x per week.  He states a few sundays ago after he completed his work out he realized he didn't feel well.  He is from [place] and presented to the ED down there.  He was evaluated and found to have pneumonia.  He was given antibiotics for this.  He came to [facility] to stay with family as he lives alone.  He completed his antibiotics, but again started feeling poorly.  He denies episodes of fever, nausea, vomiting.  He does feel short of breath.  He was taken to the ED at [facility].  CT of the chest was obtained and showed a large left sided loculated pleural effusion that was concerning for an empyema.  Dr. Cliffton Asters was contacted who recommended a pigtail catheter be placed and the patient should be transferred to [facility] for additional care and treatment.  His COVID test was negative.  He was placed on empiric antibiotics.  Currently the patient remains short of breath.  He also notices some pain along his left chest.  Current Activity/ Functional Status: Patient is independent with mobility/ambulation, transfers, ADL's, IADL's.   Past Medical History:  Diagnosis Date  . HIV (human immunodeficiency virus infection) (HCC)   . Hypertension     History reviewed. No pertinent surgical history.  Social History   Tobacco Use  Smoking Status Current Every Day Smoker   Smokeless Tobacco Never Used    Social History   Substance and Sexual Activity  Alcohol Use No     Allergies  Allergen Reactions  . Atorvastatin Other (See Comments)    Unknown    Current Facility-Administered Medications  Medication Dose Route Frequency Provider Last Rate Last Admin  . azithromycin (ZITHROMAX) 500 mg in sodium chloride 0.9 % 250 mL IVPB  500 mg Intravenous QHS Eduard Clos, MD 250 mL/hr at 04/10/20 2120 500 mg at 04/10/20 2120  . bictegravir-emtricitabine-tenofovir AF (BIKTARVY) 50-200-25 MG per tablet 1 tablet  1 tablet Oral Daily Eduard Clos, MD   1 tablet at 04/11/20 [phone]  . ondansetron (ZOFRAN) tablet 4 mg  4 mg Oral Q6H PRN Eduard Clos, MD       Or  . ondansetron [facility]) injection 4 mg  4 mg Intravenous Q6H PRN Eduard Clos, MD      . oxyCODONE (Oxy IR/ROXICODONE) immediate release tablet 5 mg  5 mg Oral Q4H PRN Dimple Nanas, MD   5 mg at 04/11/20 0915  . piperacillin-tazobactam (ZOSYN) IVPB 3.375 g  3.375 g Intravenous Q8H Amin, Ankit Chirag, MD 12.5 mL/hr at 04/11/20 0752 3.375 g at 04/11/20 0752  . polyethylene glycol (MIRALAX / GLYCOLAX) packet 17 g  17 g Oral Daily PRN Dimple Nanas, MD      .  senna-docusate (Senokot-S) tablet 2 tablet  2 tablet Oral QHS PRN Amin, Ankit Chirag, MD      . vancomycin (VANCOREADY) IVPB 500 mg/100 mL  500 mg Intravenous Q12H Eduard ClosKakrakandy, Arshad N, MD 100 mL/hr at 04/11/20 0751 500 mg at 04/11/20 0751    Medications Prior to Admission  Medication Sig Dispense Refill Last Dose  . BIKTARVY 50-200-25 MG TABS tablet Take 1 tablet by mouth daily.   Past Week at Unknown time  . clopidogrel (PLAVIX) 75 MG tablet Take 75 mg by mouth daily.   04/08/2020 at Unknown time  . gabapentin (NEURONTIN) 300 MG capsule Take 300 mg by mouth daily as needed (nerve pain).    Past Week at Unknown time  . Multiple Vitamins-Minerals (THERA-TABS M) TABS Take 1 tablet by mouth daily.   04/09/2020 at Unknown  time  . Vitamin D, Ergocalciferol, (DRISDOL) 1.25 MG (50000 UNIT) CAPS capsule Take 50,000 Units by mouth 2 (two) times a week.   Past Week at Unknown time  . diclofenac Sodium (VOLTAREN) 1 % GEL Apply topically. (Patient not taking: Reported on 04/09/2020)   Not Taking at Unknown time  . efavirenz-emtrictabine-tenofovir (ATRIPLA) 600-200-300 MG per tablet Take 1 tablet by mouth at bedtime. (Patient not taking: Reported on 04/09/2020)   Not Taking at Unknown time  . hydrochlorothiazide (HYDRODIURIL) 25 MG tablet Take 25 mg by mouth every morning. (Patient not taking: Reported on 04/09/2020)   Not Taking at Unknown time  . hydrOXYzine (VISTARIL) 25 MG capsule Take 25 mg by mouth 2 (two) times daily as needed. (Patient not taking: Reported on 04/09/2020)   Not Taking at Unknown time  . levofloxacin (LEVAQUIN) 750 MG tablet Take 750 mg by mouth daily. (Patient not taking: Reported on 04/09/2020)   Not Taking at Unknown time  . meloxicam (MOBIC) 7.5 MG tablet Take 7.5 mg by mouth 2 (two) times daily as needed. (Patient not taking: Reported on 04/09/2020)   Not Taking at Unknown time    Family History  Problem Relation Age of Onset  . Diabetes Mellitus II Father   . Stroke Neg Hx    Review of Systems:   ROS    Cardiac Review of Systems: Y or  [    ]= no  Chest Pain [  Y  ]  Resting SOB [ Y ] Exertional SOB  [  ]  Orthopnea [  ]   Pedal Edema Klaus.Mock[N   ]    Palpitations [ N ] Syncope  [ N ]   Presyncope [   ]  General Review of Systems: [Y] = yes [  ]=no Constitional: recent weight change [  ]; anorexia [  ]; fatigue [  ]; nausea [  ]; night sweats [  ]; fever [ N ]; or chills Klaus.Mock[N  ]                                                               Dental: Last Dentist visit:   Eye : blurred vision [  ]; diplopia [   ]; vision changes [  ];  Amaurosis fugax[  ]; Resp: cough Klaus.Mock[N  ];  wheezing[  ];  hemoptysis[N  ]; shortness of breath[Y ]; paroxysmal nocturnal dyspnea[  ]; dyspnea on exertion[  ];  or orthopnea[   ];  GI:  gallstones[  ], vomiting[N  ];  dysphagia[N  ]; melena[  ];  hematochezia [  ]; heartburn[  ];   Hx of  Colonoscopy[  ]; GU: kidney stones [  ]; hematuria[  ];   dysuria [  ];  nocturia[  ];  history of     obstruction [  ]; urinary frequency [  ]             Skin: rash, swelling[N  ];, hair loss[  ];  peripheral edema[  ];  or itching[  ]; Musculosketetal: myalgias[  ];  joint swelling[  ];  joint erythema[  ];  joint pain[  ];  back pain[  ];  Heme/Lymph: bruising[  ];  bleeding[  ];  anemia[  ];  Neuro: TIA[  ];  headaches[  ];  stroke[ Y, H/O ];  vertigo[  ];  seizures[  ];   paresthesias[  ];  difficulty walking[  ];  Psych:depression[  ]; anxiety[  ];  Endocrine: diabetes[  ];  thyroid dysfunction[  ];  Physical Exam: BP 112/68 (BP Location: Right Arm)   Pulse 82   Temp 98.8 F (37.1 C)   Resp 15   Ht 5\' 9"  (1.753 m)   Wt 55.8 kg   SpO2 95%   BMI 18.16 kg/m   General appearance: alert, cooperative and no distress Head: Normocephalic, without obvious abnormality, atraumatic Resp: diminished breath sounds on left Cardio: regular rate and rhythm GI: soft, non-tender; bowel sounds normal; no masses,  no organomegaly Extremities: extremities normal, atraumatic, no cyanosis or edema Neurologic: Grossly normal  Diagnostic Studies & Laboratory data:     Recent Radiology Findings:   DG Chest 2 View  Result Date: 04/09/2020 CLINICAL DATA:  Chest pain. Additional provided: Shortness of breath and chest pain for 3-4 days, patient reports recent stroke and pneumonia, HIV, smoker, hypertension. EXAM: CHEST - 2 VIEW COMPARISON:  Prior chest radiographs 03/21/2012. FINDINGS: Heart size within normal limits. There is abnormal opacity throughout much of the left lung field with associated left-sided volume loss and air lucency outlining the aortic knob. Findings likely reflect significant left upper and lower lobe atelectasis with hyperexpansion of the superior segment of the left  lower lobe. Probable underlying airspace consolidation within the left lower lobe. The right lung is clear. No definite pleural effusion or evidence of pneumothorax. No acute bony abnormality identified. These results were called by telephone at the time of interpretation on 04/09/2020 at 6:05 pm to provider DAN FLOYD , who verbally acknowledged these results. IMPRESSION: Abnormal opacity throughout much of the left lung field with associated left-sided volume loss. Findings likely reflect significant partial collapse of the left upper and lower lobes. Probable underlying airspace consolidation within the left lower lobe. Contrast-enhanced chest CT is recommended for further evaluation and to exclude a central obstructing mass. The right lung is clear. Electronically Signed   By: 04/11/2020 DO   On: 04/09/2020 18:06   CT Angio Chest PE W and/or Wo Contrast  Result Date: 04/09/2020 CLINICAL DATA:  Shortness of breath and chest pain. Stroke and pneumonia. EXAM: CT ANGIOGRAPHY CHEST WITH CONTRAST TECHNIQUE: Multidetector CT imaging of the chest was performed using the standard protocol during bolus administration of intravenous contrast. Multiplanar CT image reconstructions and MIPs were obtained to evaluate the vascular anatomy. CONTRAST:  04/11/2020 OMNIPAQUE IOHEXOL 350 MG/ML SOLN COMPARISON:  Chest radiography same day FINDINGS: Cardiovascular: Pulmonary arterial opacification is  good. There are no pulmonary emboli. Mediastinum/Nodes: No mass or lymphadenopathy. Lungs/Pleura: On the left, there is a loculated pleural effusion in the posterior chest with associated compressive atelectasis of the posterior left lung. The non compressed lung appears clear. On the right, there is mild dependent atelectasis. The patient does have some emphysematous change in the lungs, more towards the inferior lungs than the superior lungs. Upper Abdomen: Negative Musculoskeletal: Normal.  Pectus configuration of the chest. Review of  the MIP images confirms the above findings. IMPRESSION: No pulmonary emboli. Large loculated pleural fluid collection in the left posterior chest suggesting possible empyema. Compressive atelectasis of the posterior adjacent left lung. Aerated lung does not show obvious infiltrate. Emphysematous changes, basilar predominant. Emphysema (ICD10-J43.9). Electronically Signed   By: Paulina Fusi M.D.   On: 04/09/2020 20:09    I have independently reviewed the above radiologic studies and discussed with the patient   Recent Lab Findings: Lab Results  Component Value Date   WBC 9.1 04/10/2020   HGB 10.6 (L) 04/10/2020   HCT 31.5 (L) 04/10/2020   PLT 548 (H) 04/10/2020   GLUCOSE 98 04/10/2020   CHOL 140 03/22/2012   TRIG 92 03/22/2012   HDL 46 03/22/2012   LDLCALC 76 03/22/2012   ALT 20 03/20/2012   AST 20 03/20/2012   NA 136 04/10/2020   K 5.9 (H) 04/10/2020   CL 104 04/10/2020   CREATININE 1.12 04/10/2020   BUN 18 04/10/2020   CO2 24 04/10/2020   TSH 3.209 Test methodology is 3rd generation TSH 07/31/2009   INR 0.93 03/20/2012   HGBA1C 4.6 03/22/2012    Assessment / Plan:      1. Left Pleural Effusion- CT suggestive of empyema-- patient had completed ABX as outpatient, now readmitted 2. HIV- on Bitkarvy 3. H/O Stroke- Plavix currently being held 4. Dispo- patient stable, afebrile.. Dr. Cliffton Asters notified of patient's arrival.  He will follow up this afternoon with further recommendations of chest tube placement vs. VATs procedure  I  spent 55 minutes counseling the patient face to face.   Lowella Dandy, PA-C  04/11/2020 9:53 AM   Agree with above.  This is a 59 year old gentleman that is transferred from [facility], with a parapneumonic effusion that appears loculated on cross-sectional imaging.  There is a dominant collection that appears accessible by image guided drain placement.  Following placement we will treat with thrombolytic therapy.  If this is unsuccessful  then he will require a VATS decortication.  We will follow peripherally Corliss Skains

## 2020-04-12 ENCOUNTER — Inpatient Hospital Stay (HOSPITAL_COMMUNITY): Payer: Medicare Other

## 2020-04-12 ENCOUNTER — Encounter (HOSPITAL_COMMUNITY)
Admission: EM | Disposition: A | Discharge status: Home / Self Care | Payer: Self-pay | Source: Home / Self Care | Attending: Internal Medicine

## 2020-04-12 LAB — CBC
HCT: 30.6 % — ABNORMAL LOW (ref 39.0–52.0)
Hemoglobin: 10.1 g/dL — ABNORMAL LOW (ref 13.0–17.0)
MCH: 27.2 pg (ref 26.0–34.0)
MCHC: 33 g/dL (ref 30.0–36.0)
MCV: 82.3 fL (ref 80.0–100.0)
Platelets: 622 10*3/uL — ABNORMAL HIGH (ref 150–400)
RBC: 3.72 MIL/uL — ABNORMAL LOW (ref 4.22–5.81)
RDW: 14.8 % (ref 11.5–15.5)
WBC: 8 10*3/uL (ref 4.0–10.5)
nRBC: 0 % (ref 0.0–0.2)

## 2020-04-12 LAB — MAGNESIUM: Magnesium: 2 mg/dL (ref 1.7–2.4)

## 2020-04-12 LAB — BASIC METABOLIC PANEL
Anion gap: 6 (ref 5–15)
BUN: 15 mg/dL (ref 6–20)
CO2: 26 mmol/L (ref 22–32)
Calcium: 8.1 mg/dL — ABNORMAL LOW (ref 8.9–10.3)
Chloride: 103 mmol/L (ref 98–111)
Creatinine, Ser: 1.27 mg/dL — ABNORMAL HIGH (ref 0.61–1.24)
GFR calc Af Amer: >60 mL/min (ref >60)
GFR calc non Af Amer: >60 mL/min (ref >60)
Glucose, Bld: 118 mg/dL — ABNORMAL HIGH (ref 70–99)
Potassium: 4.1 mmol/L (ref 3.5–5.1)
Sodium: 135 mmol/L (ref 135–145)

## 2020-04-12 LAB — GLUCOSE, CAPILLARY: Glucose-Capillary: 111 mg/dL — ABNORMAL HIGH (ref 70–99)

## 2020-04-12 LAB — VANCOMYCIN, TROUGH: Vancomycin Tr: 9 ug/mL — ABNORMAL LOW (ref 15–20)

## 2020-04-12 LAB — MRSA PCR SCREENING: MRSA by PCR: NEGATIVE

## 2020-04-12 SURGERY — VIDEO ASSISTED THORACOSCOPY (VATS)/EMPYEMA
Anesthesia: General | Site: Chest | Laterality: Left

## 2020-04-12 MED ORDER — SODIUM CHLORIDE (PF) 0.9 % IJ SOLN
10.0000 mg | Freq: Once | INTRAMUSCULAR | Status: DC
  Filled 2020-04-12: qty 10

## 2020-04-12 MED ORDER — OXYCODONE HCL 5 MG PO TABS
10.0000 mg | ORAL_TABLET | Freq: Four times a day (QID) | ORAL | Status: DC | PRN
  Administered 2020-04-12 – 2020-04-15 (×9): 10 mg via ORAL
  Filled 2020-04-12 (×9): qty 2

## 2020-04-12 MED ORDER — KETOROLAC TROMETHAMINE 30 MG/ML IJ SOLN
30.0000 mg | Freq: Four times a day (QID) | INTRAMUSCULAR | Status: DC | PRN
  Administered 2020-04-12 – 2020-04-15 (×5): 30 mg via INTRAVENOUS
  Filled 2020-04-12 (×5): qty 1

## 2020-04-12 MED ORDER — MORPHINE SULFATE (PF) 2 MG/ML IV SOLN
INTRAVENOUS | Status: AC
  Filled 2020-04-12: qty 1

## 2020-04-12 MED ORDER — MORPHINE SULFATE (PF) 2 MG/ML IV SOLN: 2.0000 mg | INTRAVENOUS | Status: DC | PRN

## 2020-04-12 MED ORDER — STERILE WATER FOR INJECTION IJ SOLN
5.0000 mg | Freq: Once | RESPIRATORY_TRACT | Status: DC
  Filled 2020-04-12: qty 5

## 2020-04-12 MED ORDER — VANCOMYCIN HCL 750 MG/150ML IV SOLN
750.0000 mg | Freq: Two times a day (BID) | INTRAVENOUS | Status: DC
  Administered 2020-04-13: 750 mg via INTRAVENOUS
  Filled 2020-04-12: qty 150

## 2020-04-12 NOTE — Progress Notes (Signed)
       [address]       [place] [address]             [phone]       04/12/20   09:33  Intrapleural Lytic Therapy  After discussing procedure with Eric Gardner, Pulmozyme 10mg  in 69ml sterile water was infused into the left pleural space via existing pigtail catheter. Pt tolerated well. [place] to remain in the closed position for 4 hours then open to resume left chest drainage.  Pt. encouraged to alternate position every 30 minutes over the next 4 hours. CXR in AM.   72m, PA-C [phone]

## 2020-04-12 NOTE — Plan of Care (Signed)

## 2020-04-12 NOTE — Progress Notes (Signed)
[facility] PROGRESS NOTE    Progress Note  Eric Gardner  XBD:[id number] DOB: 01-Aug-1961 DOA: 04/09/2020 PCP: Yisroel Ramming, MD     Brief Narrative:   Eric Gardner is an 59 y.o. male past medical history of HIV, previous stroke, chronic anemia admitted to [facility] about a week prior to admission for pneumonia which details are not available.  As per patient he was in the hospital 4 days and discharged on empiric antibiotics, following his discharge he started having left-sided chest pain which is persistent and worse with inspiration.  CT of the chest shows a loculated effusion concerning for empyema, CT surgery was consulted  Assessment/Plan:   Acute respiratory distress secondary to left-sided empyema due to healthcare associated pneumonia: He was started on supplemental oxygen and bronchodilators accompanied by incentive spirometry and flutter valve.  Started empirically on IV vancomycin and Zosyn for anaerobic coverage. CT surgery was consulted recommended CT-guided drain placement in the ER 250 cc of purulent material restarted intrapleural thrombolytic therapy. He will require serial chest x-rays.  HyperKalemia: Resolved.  HIV on antiretroviral therapy: Continue heart therapy.  History of CVA: Holding Plavix in anticipation of procedure.  Ongoing tobacco abuse: Nicotine patch.  Anemia of chronic disease: Hemoglobin seems to be close to baseline.   DVT prophylaxis: lovenox Family Communication:none Status is: Inpatient  Remains inpatient appropriate because:Hemodynamically unstable   Dispo: The patient is from: Home              Anticipated d/c is to: Home              Anticipated d/c date is: 2 days              Patient currently is medically stable to d/c.        Code Status:     Code Status Orders  (From admission, onward)         Start     Ordered   04/09/20 2222  Full code  Continuous        04/09/20 2222        Code Status  History    This patient has a current code status but no historical code status.   Advance Care Planning Activity        IV Access:    Peripheral IV   Procedures and diagnostic studies:   CT IMAGE GUIDED DRAINAGE BY PERCUTANEOUS CATHETER  Result Date: 04/11/2020 INDICATION: History of HIV, now admitted with pneumonia and loculated pleural effusion. Request made for CT-guided placement of a left-sided chest tube for infection source control purposes. EXAM: CT IMAGE GUIDED DRAINAGE BY PERCUTANEOUS CATHETER COMPARISON:  Chest CT-04/09/2020 MEDICATIONS: The patient is currently admitted to the hospital and receiving intravenous antibiotics. The antibiotics were administered within an appropriate time frame prior to the initiation of the procedure. ANESTHESIA/SEDATION: Moderate (conscious) sedation was employed during this procedure. A total of Versed 100 mg and Fentanyl 2 mcg was administered intravenously. Moderate Sedation Time: 18 minutes. The patient's level of consciousness and vital signs were monitored continuously by radiology nursing throughout the procedure under my direct supervision. CONTRAST:  None COMPLICATIONS: None immediate. PROCEDURE: Informed written consent was obtained from the patient after a discussion of the risks, benefits and alternatives to treatment. The patient was placed supine, slightly RPO on the CT gantry and a pre procedural CT was performed re-demonstrating the known moderate to large-sized partially loculated left-sided pleural effusion. The procedure was planned. A timeout was performed prior to the initiation  of the procedure. The skin overlying the inferolateral aspect of the left hemithorax was prepped and draped in the usual sterile fashion. The overlying soft tissues were anesthetized with 1% lidocaine with epinephrine. Appropriate trajectory was planned with the use of a 22 gauge spinal needle. An 18 gauge trocar needle was advanced into the abscess/fluid  collection and a short Amplatz super stiff wire was coiled within the collection. Appropriate positioning was confirmed with a limited CT scan. The tract was serially dilated allowing placement of a 12 [place] all-purpose drainage catheter. Appropriate positioning was confirmed with a limited postprocedural CT scan. Approximately 250 ml of serous fluid was aspirated. The tube was connected to a pleura vac device and sutured in place. A dressing was placed. The patient tolerated the procedure well without immediate post procedural complication. IMPRESSION: Successful CT guided placement of a 8412 French all purpose drain catheter into the basilar aspect of the left pleural space with aspiration of 250 mL of serous fluid. Samples were sent to the laboratory as requested by the ordering clinical team. Electronically Signed   By: Simonne ComeJohn  Watts M.D.   On: 04/11/2020 17:31     Medical Consultants:    None.  Anti-Infectives:   IV vancomycin, Zosyn and azithromycin  Subjective:    Margaretha SheffieldMark A Klontz relates his pain is controlled and breathing is better.  Objective:    Vitals:   04/11/20 1753 04/11/20 2200 04/11/20 2322 04/12/20 0858  BP:   114/78 123/77  Pulse:   90 79  Resp: 18 (!) 22  18  Temp:   97.9 F (36.6 C) 98.4 F (36.9 C)  TempSrc:   Oral Oral  SpO2:   95% 97%  Weight:      Height:       SpO2: 97 % O2 Flow Rate (L/min): 2 L/min   Intake/Output Summary (Last 24 hours) at 04/12/2020 0929 Last data filed at 04/12/2020 0551 Gross per 24 hour  Intake 370.27 ml  Output 1050 ml  Net -679.73 ml   Filed Weights   04/09/20 1712 04/09/20 1957  Weight: 55.8 kg 55.8 kg    Exam: General exam: In no acute distress. Respiratory system: Good air movement and clear to auscultation, chest tube in place. Cardiovascular system: S1 & S2 heard, RRR. No JVD. Gastrointestinal system: Abdomen is nondistended, soft and nontender.  Extremities: No pedal edema. Skin: No rashes, lesions or  ulcers Psychiatry: Judgement and insight appear normal. Mood & affect appropriate.   Data Reviewed:    Labs: Basic Metabolic Panel: Recent Labs  Lab 04/09/20 1831 04/09/20 1831 04/10/20 0500 04/12/20 0225  NA 140  --  136 135  K 4.2   < > 5.9* 4.1  CL 101  --  104 103  CO2  --   --  24 26  GLUCOSE 85  --  98 118*  BUN 16  --  18 15  CREATININE 1.10  --  1.12 1.27*  CALCIUM  --   --  7.9* 8.1*  MG  --   --   --  2.0   < > = values in this interval not displayed.   GFR Estimated Creatinine Clearance: 50 mL/min (A) (by C-G formula based on SCr of 1.27 mg/dL (H)). Liver Function Tests: No results for input(s): AST, ALT, ALKPHOS, BILITOT, PROT, ALBUMIN in the last 168 hours. No results for input(s): LIPASE, AMYLASE in the last 168 hours. No results for input(s): AMMONIA in the last 168 hours. Coagulation profile No  results for input(s): INR, PROTIME in the last 168 hours. COVID-19 Labs  No results for input(s): DDIMER, FERRITIN, LDH, CRP in the last 72 hours.  Lab Results  Component Value Date   SARSCOV2NAA NEGATIVE 04/09/2020    CBC: Recent Labs  Lab 04/09/20 1759 04/09/20 1831 04/10/20 0423 04/12/20 0225  WBC 8.4  --  9.1 8.0  HGB 11.6* 12.6* 10.6* 10.1*  HCT 35.3* 37.0* 31.5* 30.6*  MCV 85.5  --  83.8 82.3  PLT 549*  --  548* 622*   Cardiac Enzymes: No results for input(s): CKTOTAL, CKMB, CKMBINDEX, TROPONINI in the last 168 hours. BNP (last 3 results) No results for input(s): PROBNP in the last 8760 hours. CBG: No results for input(s): GLUCAP in the last 168 hours. D-Dimer: No results for input(s): DDIMER in the last 72 hours. Hgb A1c: No results for input(s): HGBA1C in the last 72 hours. Lipid Profile: No results for input(s): CHOL, HDL, LDLCALC, TRIG, CHOLHDL, LDLDIRECT in the last 72 hours. Thyroid function studies: No results for input(s): TSH, T4TOTAL, T3FREE, THYROIDAB in the last 72 hours.  Invalid input(s): FREET3 Anemia work up: No  results for input(s): VITAMINB12, FOLATE, FERRITIN, TIBC, IRON, RETICCTPCT in the last 72 hours. Sepsis Labs: Recent Labs  Lab 04/09/20 1759 04/10/20 0423 04/12/20 0225  WBC 8.4 9.1 8.0   Microbiology Recent Results (from the past 240 hour(s))  SARS Coronavirus 2 by RT PCR (hospital order, performed in [facility] hospital lab) Nasopharyngeal Nasopharyngeal Swab     Status: None   Collection Time: 04/09/20  9:38 PM   Specimen: Nasopharyngeal Swab  Result Value Ref Range Status   SARS Coronavirus 2 NEGATIVE NEGATIVE Final    Comment: (NOTE) SARS-CoV-2 target nucleic acids are NOT DETECTED.  The SARS-CoV-2 RNA is generally detectable in upper and lower respiratory specimens during the acute phase of infection. The lowest concentration of SARS-CoV-2 viral copies this assay can detect is 250 copies / mL. A negative result does not preclude SARS-CoV-2 infection and should not be used as the sole basis for treatment or other patient management decisions.  A negative result may occur with improper specimen collection / handling, submission of specimen other than nasopharyngeal swab, presence of viral mutation(s) within the areas targeted by this assay, and inadequate number of viral copies (<250 copies / mL). A negative result must be combined with clinical observations, patient history, and epidemiological information.  Fact Sheet for Patients:   BoilerBrush.com.cy  Fact Sheet for Healthcare Providers: https://pope.com/  This test is not yet approved or  cleared by the [place] FDA and has been authorized for detection and/or diagnosis of SARS-CoV-2 by FDA under an Emergency Use Authorization (EUA).  This EUA will remain in effect (meaning this test can be used) for the duration of the COVID-19 declaration under Section 564(b)(1) of the Act, 21 U.S.C. section 360bbb-3(b)(1), unless the authorization is terminated or revoked  sooner.  Performed at [facility], [address], [place], [place] [address]   Culture, blood (Routine X 2) w Reflex to ID Panel     Status: None (Preliminary result)   Collection Time: 04/09/20 11:50 PM   Specimen: BLOOD LEFT FOREARM  Result Value Ref Range Status   Specimen Description   Final    BLOOD LEFT FOREARM Performed at [facility], [address], [place], [place] [address]    Special Requests   Final    BOTTLES DRAWN AEROBIC AND ANAEROBIC Blood Culture adequate volume  Performed at [facility], [address] [place], [place] [address]    Culture   Final    NO GROWTH 2 DAYS Performed at [facility], [address], [place], [place] [address]    Report Status PENDING  Incomplete  Culture, blood (Routine X 2) w Reflex to ID Panel     Status: None (Preliminary result)   Collection Time: 04/09/20 11:50 PM   Specimen: BLOOD  Result Value Ref Range Status   Specimen Description   Final    BLOOD LEFT UPPER ARM Performed at [facility], [address], [place], [place] [address]    Special Requests   Final    BOTTLES DRAWN AEROBIC AND ANAEROBIC Blood Culture results may not be optimal due to an excessive volume of blood received in culture bottles Performed at [facility], [address] [place], [place] [address]    Culture   Final    NO GROWTH 2 DAYS Performed at [facility], [address], [place], [place] [address]    Report Status PENDING  Incomplete  Aerobic/Anaerobic Culture (surgical/deep wound)     Status: None (Preliminary result)   Collection Time: 04/11/20  3:02 PM   Specimen: Wound; Abscess  Result Value Ref Range Status   Specimen Description WOUND LEFT CHEST  Final   Special Requests DRAIN CHEST TUBE  Final   Gram Stain   Final    NO WBC SEEN NO ORGANISMS SEEN Performed at [facility], [address], [place], [place] [address]    Culture PENDING   Incomplete   Report Status PENDING  Incomplete     Medications:   . alteplase (TPA) for intrapleural administration  10 mg Intrapleural Once   And  . pulmozyme (DORNASE) for intrapleural administration  5 mg Intrapleural Once  . bictegravir-emtricitabine-tenofovir AF  1 tablet Oral Daily   Continuous Infusions: . azithromycin 500 mg (04/11/20 2210)  . piperacillin-tazobactam (ZOSYN)  IV 3.375 g (04/12/20 0918)  . vancomycin 500 mg (04/12/20 0918)      LOS: 3 days   Marinda Elk  [facility]  04/12/2020, 9:29 AM

## 2020-04-12 NOTE — Progress Notes (Signed)
Pharmacy Antibiotic Note  Eric Gardner is a 59 y.o. male admitted on 04/09/2020 with empyema. Pharmacy consulted for vancomycin and pip-tazo dosing. Vancomycin trough subtherapeutic at 9 mcg/ml, Cr up a bit this morning.  Plan: -Increase vancomycin 750mg  IV q12h - smaller increase with rise in Cr -Continue Zosyn 3.375g IV EI q8h -Watch Cr closely  Height: 5\' 9"  (175.3 cm) Weight: 55.8 kg (123 lb) IBW/kg (Calculated) : 70.7  Temp (24hrs), Avg:98.2 F (36.8 C), Min:97.5 F (36.4 C), Max:98.8 F (37.1 C)  Recent Labs  Lab 04/09/20 1759 04/09/20 1831 04/10/20 0423 04/10/20 0500 04/12/20 0225 04/12/20 2055  WBC 8.4  --  9.1  --  8.0  --   CREATININE  --  1.10  --  1.12 1.27*  --   VANCOTROUGH  --   --   --   --   --  9*    Estimated Creatinine Clearance: 50 mL/min (A) (by C-G formula based on SCr of 1.27 mg/dL (H)).    Allergies  Allergen Reactions  . Atorvastatin Other (See Comments)    Unknown    Antimicrobials this admission: Ceftriaxone 6/29 x1 Vancomycin 6/28 >> Azithromycin 6/28  >> Zosyn 6/29 >>    Microbiology results: 6/28 BCx: NGTD 6/30 Abscess: NGTD 7/1 MRSA PCR: negative  Thank you for allowing pharmacy to be a part of this patient's care.   7/30, PharmD, BCPS Clinical Pharmacist [phone] Please check AMION for all [facility] Pharm[facility] numbers 04/12/2020

## 2020-04-12 NOTE — Progress Notes (Signed)
Floor coverage  Nursing staff concern appears anxious and tachypneic.  He was seen and examined at bedside.  Resting comfortably watching television but did appear tachypneic.  Not hypoxic.  Equal air entry bilaterally upon auscultation of the lungs.  No wheezing, rales, or rhonchi.  Complaining of pain at the site of chest tube insertion.  Currently has p.o. oxycodone 5 mg every 4 hours ordered which is not sufficient to control his pain.  Patient states he does not want to take morphine or any IV opioids because of the way they make him feel. -Stat chest x-ray ordered -Toradol as needed for pain -Increased dose of p.o. oxycodone to 10 mg every 6 hours as needed.

## 2020-04-12 NOTE — Progress Notes (Addendum)
      [address]       [place] [address]             [phone]       Subjective:  Patient with pain at chest tube site.  States waiting on nursing to bring him some pain medication  Objective: Vital signs in last 24 hours: Temp:  [97.9 F (36.6 C)-98.5 F (36.9 C)] 97.9 F (36.6 C) (06/30 2322) Pulse Rate:  [86-94] 90 (06/30 2322) Cardiac Rhythm: Normal sinus rhythm (07/01 0706) Resp:  [18-24] 22 (06/30 2200) BP: (114-132)/(61-87) 114/78 (06/30 2322) SpO2:  [94 %-100 %] 95 % (06/30 2322)  Intake/Output from previous day: 06/30 0701 - 07/01 0700 In: 370.3 [P.O.:120; IV Piggyback:250.3] Out: 1050 [Urine:800; Drains:250]  General appearance: alert, cooperative and no distress Heart: regular rate and rhythm Lungs: diminished breath sounds left base Abdomen: soft, non-tender; bowel sounds normal; no masses,  no organomegaly Extremities: extremities normal, atraumatic, no cyanosis or edema Wound: clean and dry  Lab Results: Recent Labs    04/10/20 0423 04/12/20 0225  WBC 9.1 8.0  HGB 10.6* 10.1*  HCT 31.5* 30.6*  PLT 548* 622*   BMET:  Recent Labs    04/10/20 0500 04/12/20 0225  NA 136 135  K 5.9* 4.1  CL 104 103  CO2 24 26  GLUCOSE 98 118*  BUN 18 15  CREATININE 1.12 1.27*  CALCIUM 7.9* 8.1*    PT/INR: No results for input(s): LABPROT, INR in the last 72 hours. ABG    Component Value Date/Time   PHART 7.482 (H) 09/16/2007 2002   HCO3 27.1 (H) 10/06/2007 1744   TCO2 28 04/09/2020 1831   ACIDBASEDEF 0.8 09/16/2007 2002   O2SAT 97.3 09/16/2007 2002   CBG (last 3)  No results for input(s): GLUCAP in the last 72 hours.  Assessment/Plan: S/P Procedure(s) (LRB): VIDEO ASSISTED THORACOSCOPY (VATS)/EMPYEMA (Left) DECORTICATION (Left)  1. Left Pleural effusion/empyema- pigtail catheter in place, as discussed with Dr. Cliffton Asters will place TPA today 2. Dispo- patient with expected pain at chest tube site, will place TPA today,  repeat CXR in AM.. care per primary   LOS: 3 days    Erin Barrett, PA-C 04/12/2020   CT-guided drain placed with return of 250 mL. We will start intrapleural thrombolytic therapy. Will require serial chest x-rays.  Regis Wiland Keane Scrape

## 2020-04-13 ENCOUNTER — Inpatient Hospital Stay (HOSPITAL_COMMUNITY): Payer: Medicare Other

## 2020-04-13 LAB — CBC
HCT: 31.4 % — ABNORMAL LOW (ref 39.0–52.0)
Hemoglobin: 10.5 g/dL — ABNORMAL LOW (ref 13.0–17.0)
MCH: 27.5 pg (ref 26.0–34.0)
MCHC: 33.4 g/dL (ref 30.0–36.0)
MCV: 82.2 fL (ref 80.0–100.0)
Platelets: 723 10*3/uL — ABNORMAL HIGH (ref 150–400)
RBC: 3.82 MIL/uL — ABNORMAL LOW (ref 4.22–5.81)
RDW: 14.8 % (ref 11.5–15.5)
WBC: 13 10*3/uL — ABNORMAL HIGH (ref 4.0–10.5)
nRBC: 0 % (ref 0.0–0.2)

## 2020-04-13 LAB — MAGNESIUM: Magnesium: 2 mg/dL (ref 1.7–2.4)

## 2020-04-13 LAB — BASIC METABOLIC PANEL
Anion gap: 10 (ref 5–15)
BUN: 18 mg/dL (ref 6–20)
CO2: 23 mmol/L (ref 22–32)
Calcium: 8.2 mg/dL — ABNORMAL LOW (ref 8.9–10.3)
Chloride: 101 mmol/L (ref 98–111)
Creatinine, Ser: 1.38 mg/dL — ABNORMAL HIGH (ref 0.61–1.24)
GFR calc Af Amer: >60 mL/min (ref >60)
GFR calc non Af Amer: 56 mL/min — ABNORMAL LOW (ref >60)
Glucose, Bld: 115 mg/dL — ABNORMAL HIGH (ref 70–99)
Potassium: 4.4 mmol/L (ref 3.5–5.1)
Sodium: 134 mmol/L — ABNORMAL LOW (ref 135–145)

## 2020-04-13 MED ORDER — GABAPENTIN 300 MG PO CAPS: 300.0000 mg | ORAL_CAPSULE | Freq: Every day | ORAL | Status: DC | PRN

## 2020-04-13 MED ORDER — EFAVIRENZ-EMTRICITAB-TENOFOVIR 600-200-300 MG PO TABS: 1.0000 | ORAL_TABLET | Freq: Every day | ORAL | Status: DC

## 2020-04-13 MED ORDER — CLOPIDOGREL BISULFATE 75 MG PO TABS
75.0000 mg | ORAL_TABLET | Freq: Every day | ORAL | Status: DC
  Administered 2020-04-13 – 2020-04-16 (×4): 75 mg via ORAL
  Filled 2020-04-13 (×4): qty 1

## 2020-04-13 MED ORDER — HYDROCHLOROTHIAZIDE 25 MG PO TABS
25.0000 mg | ORAL_TABLET | Freq: Every morning | ORAL | Status: DC
  Administered 2020-04-13 – 2020-04-16 (×4): 25 mg via ORAL
  Filled 2020-04-13 (×4): qty 1

## 2020-04-13 NOTE — Progress Notes (Addendum)
      [address]       [place] [address]             [phone]    Subjective:  No new complaints.  Continues to have some pain which relieves with pain medication  Objective: Vital signs in last 24 hours: Temp:  [97.5 F (36.4 C)-98.8 F (37.1 C)] 97.9 F (36.6 C) (07/02 0825) Pulse Rate:  [73-100] 73 (07/02 0825) Cardiac Rhythm: Normal sinus rhythm (07/02 0712) Resp:  [18-32] 31 (07/01 2136) BP: (102-151)/(67-102) 108/67 (07/02 0825) SpO2:  [93 %-98 %] 98 % (07/02 0825)  Intake/Output from previous day: 07/01 0701 - 07/02 0700 In: 422.7 [P.O.:240; IV Piggyback:182.7] Out: 400 [Urine:400]  General appearance: alert, cooperative and no distress Heart: regular rate and rhythm Lungs: absent on left Abdomen: soft, non-tender; bowel sounds normal; no masses,  no organomegaly Extremities: extremities normal, atraumatic, no cyanosis or edema Wound: clean and dry  Lab Results: Recent Labs    04/12/20 0225 04/13/20 0335  WBC 8.0 13.0*  HGB 10.1* 10.5*  HCT 30.6* 31.4*  PLT 622* 723*   BMET:  Recent Labs    04/12/20 0225 04/13/20 0335  NA 135 134*  K 4.1 4.4  CL 103 101  CO2 26 23  GLUCOSE 118* 115*  BUN 15 18  CREATININE 1.27* 1.38*  CALCIUM 8.1* 8.2*    PT/INR: No results for input(s): LABPROT, INR in the last 72 hours. ABG    Component Value Date/Time   PHART 7.482 (H) 09/16/2007 2002   HCO3 27.1 (H) 10/06/2007 1744   TCO2 28 04/09/2020 1831   ACIDBASEDEF 0.8 09/16/2007 2002   O2SAT 97.3 09/16/2007 2002   CBG (last 3)  Recent Labs    04/12/20 1513  GLUCAP 111*    Assessment/Plan: S/P Procedure(s) (LRB): VIDEO ASSISTED THORACOSCOPY (VATS)/EMPYEMA (Left) DECORTICATION (Left)  1. Left Pleural Effusion/Empyema- TPA instilled yesterday, I have opened patient's valve to start draining today.  There was immediate output of 500 ml  2. Dispo- patient stable, keep CT to suction today to drain, patient can be disconnected to  ambulate which can help with drainage.  Will repeat CXR in AM   LOS: 4 days    Erin Barrett, PA-C  04/13/2020   Drained over 800 since the tube was reopened. Likely will obtain good result. Patient states that his breathing is much improved Please obtain a chest x-ray tomorrow. Continue pulmonary toilet and ambulation.  Rosamaria Donn Keane Scrape

## 2020-04-13 NOTE — Progress Notes (Addendum)
[facility] PROGRESS NOTE    Progress Note  ERRIK MITCHELLE  QJF:[id number] DOB: 08/18/1961 DOA: 04/09/2020 PCP: Yisroel Ramming, MD     Brief Narrative:   Eric Gardner is an 59 y.o. male past medical history of HIV, previous stroke, chronic anemia admitted to [facility] about a week prior to admission for pneumonia which details are not available.  As per patient he was in the hospital 4 days and discharged on empiric antibiotics, following his discharge he started having left-sided chest pain which is persistent and worse with inspiration.  CT of the chest shows a loculated effusion concerning for empyema, CT surgery was consulted  Assessment/Plan:   Acute respiratory distress secondary to left-sided empyema due to healthcare associated pneumonia: Continue IV empiric antibiotics for 7 days.   Status post chest tube placement with TPA, yesterday yielded about 500 cc of purulent material further management per CT surgery. Also but has remained afebrile satting greater than 96% on room air. We will go ahead and restart Plavix.  HyperKalemia: Resolved.  HIV on antiretroviral therapy: Continue heart therapy.  History of CVA: Holding Plavix in anticipation of procedure.  Ongoing tobacco abuse: Nicotine patch.  Anemia of chronic disease: Hemoglobin seems to be close to baseline.   DVT prophylaxis: lovenox Family Communication:none Status is: Inpatient  Remains inpatient appropriate because:Hemodynamically unstable   Dispo: The patient is from: Home              Anticipated d/c is to: Home              Anticipated d/c date is: 2 days              Patient currently: Is not medically stable CV surgery to dictate when to discharge home   Code Status:     Code Status Orders  (From admission, onward)         Start     Ordered   04/09/20 2222  Full code  Continuous        04/09/20 2222        Code Status History    This patient has a current code status but  no historical code status.   Advance Care Planning Activity        IV Access:    Peripheral IV   Procedures and diagnostic studies:   DG CHEST PORT 1 VIEW  Result Date: 04/13/2020 CLINICAL DATA:  Left pleural effusion. EXAM: PORTABLE CHEST 1 VIEW COMPARISON:  04/12/2020.  CT 04/11/2020, 04/09/2020. FINDINGS: Left chest tube in stable position. Persistent large left pleural effusion. Underlying atelectatic changes/infiltrate left lung. Right lung is clear. No pneumothorax. Cardiomegaly. No pulmonary venous congestion. Thoracic spine scoliosis. IMPRESSION: 1. Left chest tube in stable position. Persistent large left pleural effusion. Underlying atelectatic changes/infiltrate left lung. 2.  Cardiomegaly.  No pulmonary venous congestion. Electronically Signed   By: Maisie Fus  Register   On: 04/13/2020 06:42   DG CHEST PORT 1 VIEW  Result Date: 04/12/2020 CLINICAL DATA:  Chest tube placement, left pleural effusion EXAM: PORTABLE CHEST 1 VIEW COMPARISON:  04/11/2020 FINDINGS: Single frontal view of the chest demonstrates left chest tube coiled over left lateral lung base. There is persistent left pleural effusion and left lung consolidation not appreciably changed since the procedural examination. No pneumothorax. Right chest is clear. IMPRESSION: 1. Stable left chest tube, with persistent left effusion and consolidation. Electronically Signed   By: Sharlet Salina M.D.   On: 04/12/2020 20:39   CT IMAGE GUIDED  DRAINAGE BY PERCUTANEOUS CATHETER  Result Date: 04/11/2020 INDICATION: History of HIV, now admitted with pneumonia and loculated pleural effusion. Request made for CT-guided placement of a left-sided chest tube for infection source control purposes. EXAM: CT IMAGE GUIDED DRAINAGE BY PERCUTANEOUS CATHETER COMPARISON:  Chest CT-04/09/2020 MEDICATIONS: The patient is currently admitted to the hospital and receiving intravenous antibiotics. The antibiotics were administered within an appropriate time  frame prior to the initiation of the procedure. ANESTHESIA/SEDATION: Moderate (conscious) sedation was employed during this procedure. A total of Versed 100 mg and Fentanyl 2 mcg was administered intravenously. Moderate Sedation Time: 18 minutes. The patient's level of consciousness and vital signs were monitored continuously by radiology nursing throughout the procedure under my direct supervision. CONTRAST:  None COMPLICATIONS: None immediate. PROCEDURE: Informed written consent was obtained from the patient after a discussion of the risks, benefits and alternatives to treatment. The patient was placed supine, slightly RPO on the CT gantry and a pre procedural CT was performed re-demonstrating the known moderate to large-sized partially loculated left-sided pleural effusion. The procedure was planned. A timeout was performed prior to the initiation of the procedure. The skin overlying the inferolateral aspect of the left hemithorax was prepped and draped in the usual sterile fashion. The overlying soft tissues were anesthetized with 1% lidocaine with epinephrine. Appropriate trajectory was planned with the use of a 22 gauge spinal needle. An 18 gauge trocar needle was advanced into the abscess/fluid collection and a short Amplatz super stiff wire was coiled within the collection. Appropriate positioning was confirmed with a limited CT scan. The tract was serially dilated allowing placement of a 12 [place] all-purpose drainage catheter. Appropriate positioning was confirmed with a limited postprocedural CT scan. Approximately 250 ml of serous fluid was aspirated. The tube was connected to a pleura vac device and sutured in place. A dressing was placed. The patient tolerated the procedure well without immediate post procedural complication. IMPRESSION: Successful CT guided placement of a 49 French all purpose drain catheter into the basilar aspect of the left pleural space with aspiration of 250 mL of serous fluid.  Samples were sent to the laboratory as requested by the ordering clinical team. Electronically Signed   By: Simonne Come M.D.   On: 04/11/2020 17:31     Medical Consultants:    None.  Anti-Infectives:   IV vancomycin, Zosyn and azithromycin  Subjective:    Margaretha Sheffield his pain is controlled breathing continues to improve.  Objective:    Vitals:   04/12/20 1614 04/12/20 1946 04/12/20 2136 04/13/20 0825  BP: (!) 151/102 102/80  108/67  Pulse: 87 100  73  Resp: 19 (!) 32 (!) 31   Temp: (!) 97.5 F (36.4 C) 98.8 F (37.1 C)  97.9 F (36.6 C)  TempSrc:  Oral    SpO2: 93% 96%  98%  Weight:      Height:       SpO2: 98 % O2 Flow Rate (L/min): 2 L/min   Intake/Output Summary (Last 24 hours) at 04/13/2020 0902 Last data filed at 04/13/2020 0516 Gross per 24 hour  Intake 422.74 ml  Output 400 ml  Net 22.74 ml   Filed Weights   04/09/20 1712 04/09/20 1957  Weight: 55.8 kg 55.8 kg    Exam: General exam: In no acute distress. Respiratory system: Good air movement and clear to auscultation, chest tube in place Cardiovascular system: S1 & S2 heard, RRR. No JVD. Gastrointestinal system: Abdomen is nondistended, soft and nontender.  Extremities: No pedal edema. Skin: No rashes, lesions or ulcers   Data Reviewed:    Labs: Basic Metabolic Panel: Recent Labs  Lab 04/09/20 1831 04/09/20 1831 04/10/20 0500 04/10/20 0500 04/12/20 0225 04/13/20 0335  NA 140  --  136  --  135 134*  K 4.2   < > 5.9*   < > 4.1 4.4  CL 101  --  104  --  103 101  CO2  --   --  24  --  26 23  GLUCOSE 85  --  98  --  118* 115*  BUN 16  --  18  --  15 18  CREATININE 1.10  --  1.12  --  1.27* 1.38*  CALCIUM  --   --  7.9*  --  8.1* 8.2*  MG  --   --   --   --  2.0 2.0   < > = values in this interval not displayed.   GFR Estimated Creatinine Clearance: 46.1 mL/min (A) (by C-G formula based on SCr of 1.38 mg/dL (H)). Liver Function Tests: No results for input(s): AST, ALT, ALKPHOS,  BILITOT, PROT, ALBUMIN in the last 168 hours. No results for input(s): LIPASE, AMYLASE in the last 168 hours. No results for input(s): AMMONIA in the last 168 hours. Coagulation profile No results for input(s): INR, PROTIME in the last 168 hours. COVID-19 Labs  No results for input(s): DDIMER, FERRITIN, LDH, CRP in the last 72 hours.  Lab Results  Component Value Date   SARSCOV2NAA NEGATIVE 04/09/2020    CBC: Recent Labs  Lab 04/09/20 1759 04/09/20 1831 04/10/20 0423 04/12/20 0225 04/13/20 0335  WBC 8.4  --  9.1 8.0 13.0*  HGB 11.6* 12.6* 10.6* 10.1* 10.5*  HCT 35.3* 37.0* 31.5* 30.6* 31.4*  MCV 85.5  --  83.8 82.3 82.2  PLT 549*  --  548* 622* 723*   Cardiac Enzymes: No results for input(s): CKTOTAL, CKMB, CKMBINDEX, TROPONINI in the last 168 hours. BNP (last 3 results) No results for input(s): PROBNP in the last 8760 hours. CBG: Recent Labs  Lab 04/12/20 1513  GLUCAP 111*   D-Dimer: No results for input(s): DDIMER in the last 72 hours. Hgb A1c: No results for input(s): HGBA1C in the last 72 hours. Lipid Profile: No results for input(s): CHOL, HDL, LDLCALC, TRIG, CHOLHDL, LDLDIRECT in the last 72 hours. Thyroid function studies: No results for input(s): TSH, T4TOTAL, T3FREE, THYROIDAB in the last 72 hours.  Invalid input(s): FREET3 Anemia work up: No results for input(s): VITAMINB12, FOLATE, FERRITIN, TIBC, IRON, RETICCTPCT in the last 72 hours. Sepsis Labs: Recent Labs  Lab 04/09/20 1759 04/10/20 0423 04/12/20 0225 04/13/20 0335  WBC 8.4 9.1 8.0 13.0*   Microbiology Recent Results (from the past 240 hour(s))  SARS Coronavirus 2 by RT PCR (hospital order, performed in [facility] hospital lab) Nasopharyngeal Nasopharyngeal Swab     Status: None   Collection Time: 04/09/20  9:38 PM   Specimen: Nasopharyngeal Swab  Result Value Ref Range Status   SARS Coronavirus 2 NEGATIVE NEGATIVE Final    Comment: (NOTE) SARS-CoV-2 target nucleic acids are NOT  DETECTED.  The SARS-CoV-2 RNA is generally detectable in upper and lower respiratory specimens during the acute phase of infection. The lowest concentration of SARS-CoV-2 viral copies this assay can detect is 250 copies / mL. A negative result does not preclude SARS-CoV-2 infection and should not be used as the sole basis for treatment or other patient management decisions.  A negative result may occur with improper specimen collection / handling, submission of specimen other than nasopharyngeal swab, presence of viral mutation(s) within the areas targeted by this assay, and inadequate number of viral copies (<250 copies / mL). A negative result must be combined with clinical observations, patient history, and epidemiological information.  Fact Sheet for Patients:   BoilerBrush.com.cy  Fact Sheet for Healthcare Providers: https://pope.com/  This test is not yet approved or  cleared by the [place] FDA and has been authorized for detection and/or diagnosis of SARS-CoV-2 by FDA under an Emergency Use Authorization (EUA).  This EUA will remain in effect (meaning this test can be used) for the duration of the COVID-19 declaration under Section 564(b)(1) of the Act, 21 U.S.C. section 360bbb-3(b)(1), unless the authorization is terminated or revoked sooner.  Performed at [facility], [address], [place], [place] [address]   Culture, blood (Routine X 2) w Reflex to ID Panel     Status: None (Preliminary result)   Collection Time: 04/09/20 11:50 PM   Specimen: BLOOD LEFT FOREARM  Result Value Ref Range Status   Specimen Description   Final    BLOOD LEFT FOREARM Performed at [facility], [address] [place], [place] [address]    Special Requests   Final    BOTTLES DRAWN AEROBIC AND ANAEROBIC Blood Culture adequate volume Performed at [facility], [address], [place],  [place] [address]    Culture   Final    NO GROWTH 3 DAYS Performed at [facility], [address] [place], [place] [address]    Report Status PENDING  Incomplete  Culture, blood (Routine X 2) w Reflex to ID Panel     Status: None (Preliminary result)   Collection Time: 04/09/20 11:50 PM   Specimen: BLOOD  Result Value Ref Range Status   Specimen Description   Final    BLOOD LEFT UPPER ARM Performed at [facility], [address] [place], [place] [address]    Special Requests   Final    BOTTLES DRAWN AEROBIC AND ANAEROBIC Blood Culture results may not be optimal due to an excessive volume of blood received in culture bottles Performed at [facility], [address] [place], [place] [address]    Culture   Final    NO GROWTH 3 DAYS Performed at [facility], [address] [place], [place] [address]    Report Status PENDING  Incomplete  Aerobic/Anaerobic Culture (surgical/deep wound)     Status: None (Preliminary result)   Collection Time: 04/11/20  3:02 PM   Specimen: Wound; Abscess  Result Value Ref Range Status   Specimen Description WOUND LEFT CHEST  Final   Special Requests DRAIN CHEST TUBE  Final   Gram Stain NO WBC SEEN NO ORGANISMS SEEN   Final   Culture   Final    NO GROWTH < 24 HOURS Performed at [facility], [address], [place], [place] [address]    Report Status PENDING  Incomplete  MRSA PCR Screening     Status: None   Collection Time: 04/12/20  1:30 PM   Specimen: Nasal Mucosa; Nasopharyngeal  Result Value Ref Range Status   MRSA by PCR NEGATIVE NEGATIVE Final    Comment:        The GeneXpert MRSA Assay (FDA approved for NASAL specimens only), is one component of a comprehensive MRSA colonization surveillance program. It  is not intended to diagnose MRSA infection nor to guide or monitor treatment for MRSA infections. Performed at [facility], [address], [place], [place] [address]       Medications:   . alteplase (TPA) for intrapleural administration  10 mg Intrapleural Once   And  . pulmozyme (DORNASE) for intrapleural administration  5 mg Intrapleural Once  . bictegravir-emtricitabine-tenofovir AF  1 tablet Oral Daily   Continuous Infusions: . azithromycin Stopped (04/12/20 2136)  . piperacillin-tazobactam (ZOSYN)  IV 3.375 g (04/13/20 0145)  . vancomycin        LOS: 4 days   Marinda Elk  [facility]  04/13/2020, 9:02 AM

## 2020-04-13 NOTE — Plan of Care (Signed)

## 2020-04-14 ENCOUNTER — Inpatient Hospital Stay (HOSPITAL_COMMUNITY): Payer: Medicare Other

## 2020-04-14 LAB — CBC
HCT: 27.1 % — ABNORMAL LOW (ref 39.0–52.0)
Hemoglobin: 8.9 g/dL — ABNORMAL LOW (ref 13.0–17.0)
MCH: 27.2 pg (ref 26.0–34.0)
MCHC: 32.8 g/dL (ref 30.0–36.0)
MCV: 82.9 fL (ref 80.0–100.0)
Platelets: 692 10*3/uL — ABNORMAL HIGH (ref 150–400)
RBC: 3.27 MIL/uL — ABNORMAL LOW (ref 4.22–5.81)
RDW: 14.8 % (ref 11.5–15.5)
WBC: 5.4 10*3/uL (ref 4.0–10.5)
nRBC: 0 % (ref 0.0–0.2)

## 2020-04-14 LAB — BASIC METABOLIC PANEL
Anion gap: 7 (ref 5–15)
BUN: 22 mg/dL — ABNORMAL HIGH (ref 6–20)
CO2: 25 mmol/L (ref 22–32)
Calcium: 7.9 mg/dL — ABNORMAL LOW (ref 8.9–10.3)
Chloride: 103 mmol/L (ref 98–111)
Creatinine, Ser: 1.33 mg/dL — ABNORMAL HIGH (ref 0.61–1.24)
GFR calc Af Amer: >60 mL/min (ref >60)
GFR calc non Af Amer: 59 mL/min — ABNORMAL LOW (ref >60)
Glucose, Bld: 101 mg/dL — ABNORMAL HIGH (ref 70–99)
Potassium: 3.9 mmol/L (ref 3.5–5.1)
Sodium: 135 mmol/L (ref 135–145)

## 2020-04-14 LAB — MAGNESIUM: Magnesium: 2 mg/dL (ref 1.7–2.4)

## 2020-04-14 NOTE — Progress Notes (Signed)
[facility] PROGRESS NOTE    Progress Note  Eric Gardner  NOI:[id number] DOB: [birth year] DOA: 04/09/2020 PCP: Yisroel Ramming, MD     Brief Narrative:   Eric Gardner is an 59 y.o. male past medical history of HIV, previous stroke, chronic anemia admitted to [facility] about a week prior to admission for pneumonia which details are not available.  As per patient he was in the hospital 4 days and discharged on empiric antibiotics, following his discharge he started having left-sided chest pain which is persistent and worse with inspiration.  CT of the chest shows a loculated effusion concerning for empyema, CT surgery was consulted  Assessment/Plan:   Acute respiratory distress secondary to left-sided empyema due to healthcare associated pneumonia: Continue empiric and antibiotics for total of 7 days. Appreciate CT surgery's assistance, her chest x-ray tomorrow output through chest tube is decreasing.  We will go ahead and restart Plavix. Check an HIV and VL.  HyperKalemia: Resolved.  HIV on antiretroviral therapy: Continue heart therapy.  History of CVA: Resume Plavix  Ongoing tobacco abuse: Nicotine patch.  Anemia of chronic disease: Hemoglobin seems to be close to baseline.   DVT prophylaxis: lovenox Family Communication:none Status is: Inpatient  Remains inpatient appropriate because:Hemodynamically unstable   Dispo: The patient is from: Home              Anticipated d/c is to: Home              Anticipated d/c date is: 1 days              Patient currently: Is not medically stable CV surgery to dictate when to discharge home   Code Status:     Code Status Orders  (From admission, onward)         Start     Ordered   04/09/20 2222  Full code  Continuous        04/09/20 2222        Code Status History    This patient has a current code status but no historical code status.   Advance Care Planning Activity        IV Access:     Peripheral IV   Procedures and diagnostic studies:   DG CHEST PORT 1 VIEW  Result Date: 04/14/2020 CLINICAL DATA:  59 year old male with history of large left pleural effusion. EXAM: PORTABLE CHEST 1 VIEW COMPARISON:  Chest x-ray 04/13/2020. FINDINGS: Pigtail drainage catheter tip in the inferior aspect of the left hemithorax. Atelectasis and/or consolidation throughout the left mid to lower lung. Previously noted large left pleural effusion has decreased significantly in size and is currently small. No appreciable pneumothorax. Right lung is clear. No right pleural effusion. No evidence of pulmonary edema. Heart size is borderline enlarged. Upper mediastinal contours are within normal limits. IMPRESSION: 1. Left-sided chest tube appears similarly positioned with decreasing now small left pleural effusion revealing persistent areas of atelectasis and/or consolidation throughout the left mid to lower lung. Electronically Signed   By: Trudie Reed M.D.   On: 04/14/2020 08:49   DG CHEST PORT 1 VIEW  Result Date: 04/13/2020 CLINICAL DATA:  Left pleural effusion. EXAM: PORTABLE CHEST 1 VIEW COMPARISON:  04/12/2020.  CT 04/11/2020, 04/09/2020. FINDINGS: Left chest tube in stable position. Persistent large left pleural effusion. Underlying atelectatic changes/infiltrate left lung. Right lung is clear. No pneumothorax. Cardiomegaly. No pulmonary venous congestion. Thoracic spine scoliosis. IMPRESSION: 1. Left chest tube in stable position. Persistent large left pleural  effusion. Underlying atelectatic changes/infiltrate left lung. 2.  Cardiomegaly.  No pulmonary venous congestion. Electronically Signed   By: Maisie Fus  Register   On: 04/13/2020 06:42   DG CHEST PORT 1 VIEW  Result Date: 04/12/2020 CLINICAL DATA:  Chest tube placement, left pleural effusion EXAM: PORTABLE CHEST 1 VIEW COMPARISON:  04/11/2020 FINDINGS: Single frontal view of the chest demonstrates left chest tube coiled over left lateral  lung base. There is persistent left pleural effusion and left lung consolidation not appreciably changed since the procedural examination. No pneumothorax. Right chest is clear. IMPRESSION: 1. Stable left chest tube, with persistent left effusion and consolidation. Electronically Signed   By: Sharlet Salina M.D.   On: 04/12/2020 20:39     Medical Consultants:    None.  Anti-Infectives:   IV vancomycin, Zosyn and azithromycin  Subjective:    [name] pain is resolved breathing he relates his back to baseline.  Objective:    Vitals:   04/13/20 0900 04/13/20 1641 04/13/20 2253 04/14/20 0807  BP:  (!) 95/58 101/63 122/68  Pulse:  65 75 65  Resp: 19   19  Temp:  97.8 F (36.6 C) 97.8 F (36.6 C) 97.9 F (36.6 C)  TempSrc:   Oral   SpO2:  100% 98% 99%  Weight:      Height:       SpO2: 99 % O2 Flow Rate (L/min): 2 L/min   Intake/Output Summary (Last 24 hours) at 04/14/2020 1001 Last data filed at 04/14/2020 0924 Gross per 24 hour  Intake 420 ml  Output 1700 ml  Net -1280 ml   Filed Weights   04/09/20 1712 04/09/20 1957  Weight: 55.8 kg 55.8 kg    Exam: General exam: In no acute distress. Respiratory system: Good air movement and clear to auscultation. Cardiovascular system: S1 & S2 heard, RRR. No JVD. Gastrointestinal system: Abdomen is nondistended, soft and nontender.  Extremities: No pedal edema. Skin: No rashes, lesions or ulcers Psychiatry: Judgement and insight appear normal. Mood & affect appropriate.   Data Reviewed:    Labs: Basic Metabolic Panel: Recent Labs  Lab 04/09/20 1831 04/09/20 1831 04/10/20 0500 04/10/20 0500 04/12/20 0225 04/12/20 0225 04/13/20 0335 04/14/20 0405  NA 140  --  136  --  135  --  134* 135  K 4.2   < > 5.9*   < > 4.1   < > 4.4 3.9  CL 101  --  104  --  103  --  101 103  CO2  --   --  24  --  26  --  23 25  GLUCOSE 85  --  98  --  118*  --  115* 101*  BUN 16  --  18  --  15  --  18 22*  CREATININE 1.10  --   1.12  --  1.27*  --  1.38* 1.33*  CALCIUM  --   --  7.9*  --  8.1*  --  8.2* 7.9*  MG  --   --   --   --  2.0  --  2.0 2.0   < > = values in this interval not displayed.   GFR Estimated Creatinine Clearance: 47.8 mL/min (A) (by C-G formula based on SCr of 1.33 mg/dL (H)). Liver Function Tests: No results for input(s): AST, ALT, ALKPHOS, BILITOT, PROT, ALBUMIN in the last 168 hours. No results for input(s): LIPASE, AMYLASE in the last 168 hours. No results for input(s): AMMONIA  in the last 168 hours. Coagulation profile No results for input(s): INR, PROTIME in the last 168 hours. COVID-19 Labs  No results for input(s): DDIMER, FERRITIN, LDH, CRP in the last 72 hours.  Lab Results  Component Value Date   SARSCOV2NAA NEGATIVE 04/09/2020    CBC: Recent Labs  Lab 04/09/20 1759 04/09/20 1759 04/09/20 1831 04/10/20 0423 04/12/20 0225 04/13/20 0335 04/14/20 0405  WBC 8.4  --   --  9.1 8.0 13.0* 5.4  HGB 11.6*   < > 12.6* 10.6* 10.1* 10.5* 8.9*  HCT 35.3*   < > 37.0* 31.5* 30.6* 31.4* 27.1*  MCV 85.5  --   --  83.8 82.3 82.2 82.9  PLT 549*  --   --  548* 622* 723* 692*   < > = values in this interval not displayed.   Cardiac Enzymes: No results for input(s): CKTOTAL, CKMB, CKMBINDEX, TROPONINI in the last 168 hours. BNP (last 3 results) No results for input(s): PROBNP in the last 8760 hours. CBG: Recent Labs  Lab 04/12/20 1513  GLUCAP 111*   D-Dimer: No results for input(s): DDIMER in the last 72 hours. Hgb A1c: No results for input(s): HGBA1C in the last 72 hours. Lipid Profile: No results for input(s): CHOL, HDL, LDLCALC, TRIG, CHOLHDL, LDLDIRECT in the last 72 hours. Thyroid function studies: No results for input(s): TSH, T4TOTAL, T3FREE, THYROIDAB in the last 72 hours.  Invalid input(s): FREET3 Anemia work up: No results for input(s): VITAMINB12, FOLATE, FERRITIN, TIBC, IRON, RETICCTPCT in the last 72 hours. Sepsis Labs: Recent Labs  Lab 04/10/20 0423  04/12/20 0225 04/13/20 0335 04/14/20 0405  WBC 9.1 8.0 13.0* 5.4   Microbiology Recent Results (from the past 240 hour(s))  SARS Coronavirus 2 by RT PCR (hospital order, performed in [facility] hospital lab) Nasopharyngeal Nasopharyngeal Swab     Status: None   Collection Time: 04/09/20  9:38 PM   Specimen: Nasopharyngeal Swab  Result Value Ref Range Status   SARS Coronavirus 2 NEGATIVE NEGATIVE Final    Comment: (NOTE) SARS-CoV-2 target nucleic acids are NOT DETECTED.  The SARS-CoV-2 RNA is generally detectable in upper and lower respiratory specimens during the acute phase of infection. The lowest concentration of SARS-CoV-2 viral copies this assay can detect is 250 copies / mL. A negative result does not preclude SARS-CoV-2 infection and should not be used as the sole basis for treatment or other patient management decisions.  A negative result may occur with improper specimen collection / handling, submission of specimen other than nasopharyngeal swab, presence of viral mutation(s) within the areas targeted by this assay, and inadequate number of viral copies (<250 copies / mL). A negative result must be combined with clinical observations, patient history, and epidemiological information.  Fact Sheet for Patients:   BoilerBrush.com.cy  Fact Sheet for Healthcare Providers: https://pope.com/  This test is not yet approved or  cleared by the [place] FDA and has been authorized for detection and/or diagnosis of SARS-CoV-2 by FDA under an Emergency Use Authorization (EUA).  This EUA will remain in effect (meaning this test can be used) for the duration of the COVID-19 declaration under Section 564(b)(1) of the Act, 21 U.S.C. section 360bbb-3(b)(1), unless the authorization is terminated or revoked sooner.  Performed at [facility], [address], [place], [place] [address]   Culture, blood (Routine  X 2) w Reflex to ID Panel     Status: None (Preliminary result)   Collection Time: 04/09/20 11:50 PM  Specimen: BLOOD LEFT FOREARM  Result Value Ref Range Status   Specimen Description   Final    BLOOD LEFT FOREARM Performed at [facility], [address] [place], [place] [address]    Special Requests   Final    BOTTLES DRAWN AEROBIC AND ANAEROBIC Blood Culture adequate volume Performed at [facility], [address] [place], [place] [address]    Culture   Final    NO GROWTH 3 DAYS Performed at [facility], [address] [place], [place] [address]    Report Status PENDING  Incomplete  Culture, blood (Routine X 2) w Reflex to ID Panel     Status: None (Preliminary result)   Collection Time: 04/09/20 11:50 PM   Specimen: BLOOD  Result Value Ref Range Status   Specimen Description   Final    BLOOD LEFT UPPER ARM Performed at [facility], [address] [place], [place] [address]    Special Requests   Final    BOTTLES DRAWN AEROBIC AND ANAEROBIC Blood Culture results may not be optimal due to an excessive volume of blood received in culture bottles Performed at [facility], [address] [place], [place] [address]    Culture   Final    NO GROWTH 3 DAYS Performed at [facility], [address] [place], [place] [address]    Report Status PENDING  Incomplete  Aerobic/Anaerobic Culture (surgical/deep wound)     Status: None (Preliminary result)   Collection Time: 04/11/20  3:02 PM   Specimen: Wound; Abscess  Result Value Ref Range Status   Specimen Description WOUND LEFT CHEST  Final   Special Requests DRAIN CHEST TUBE  Final   Gram Stain NO WBC SEEN NO ORGANISMS SEEN   Final   Culture   Final    NO GROWTH 2 DAYS NO ANAEROBES ISOLATED; CULTURE IN PROGRESS FOR 5 DAYS Performed at [facility], [address] [place], [place] [address]    Report Status PENDING  Incomplete  MRSA PCR Screening      Status: None   Collection Time: 04/12/20  1:30 PM   Specimen: Nasal Mucosa; Nasopharyngeal  Result Value Ref Range Status   MRSA by PCR NEGATIVE NEGATIVE Final    Comment:        The GeneXpert MRSA Assay (FDA approved for NASAL specimens only), is one component of a comprehensive MRSA colonization surveillance program. It is not intended to diagnose MRSA infection nor to guide or monitor treatment for MRSA infections. Performed at [facility], [address] [place], [place] [address]      Medications:   . alteplase (TPA) for intrapleural administration  10 mg Intrapleural Once   And  . pulmozyme (DORNASE) for intrapleural administration  5 mg Intrapleural Once  . bictegravir-emtricitabine-tenofovir AF  1 tablet Oral Daily  . clopidogrel  75 mg Oral Daily  . hydrochlorothiazide  25 mg Oral q morning - 10a   Continuous Infusions: . piperacillin-tazobactam (ZOSYN)  IV 3.375 g (04/14/20 0152)      LOS: 5 days   Marinda ElkAbraham Feliz Ortiz  [facility]  04/14/2020, 10:01 AM

## 2020-04-14 NOTE — Progress Notes (Signed)
     [address]       [place] [address]             [phone]       Good result with thrombolytic therapy CXR much improved Pt states that breathing is also improved  Will obtain 2V CXR tomorrow.  If stable, will remove chest tube.  Jamisyn Langer Keane Scrape

## 2020-04-14 NOTE — Progress Notes (Signed)
Ok to get HIV VL and CD4 while pt is here per Dr. David Stall.   Ulyses Southward, PharmD, BCIDP, AAHIVP, CPP Infectious Disease Pharmacist 04/14/2020 9:36 AM

## 2020-04-14 NOTE — Plan of Care (Signed)

## 2020-04-15 ENCOUNTER — Inpatient Hospital Stay (HOSPITAL_COMMUNITY): Payer: Medicare Other

## 2020-04-15 LAB — BASIC METABOLIC PANEL
Anion gap: 11 (ref 5–15)
BUN: 20 mg/dL (ref 6–20)
CO2: 23 mmol/L (ref 22–32)
Calcium: 8.5 mg/dL — ABNORMAL LOW (ref 8.9–10.3)
Chloride: 103 mmol/L (ref 98–111)
Creatinine, Ser: 1.34 mg/dL — ABNORMAL HIGH (ref 0.61–1.24)
GFR calc Af Amer: >60 mL/min (ref >60)
GFR calc non Af Amer: 58 mL/min — ABNORMAL LOW (ref >60)
Glucose, Bld: 91 mg/dL (ref 70–99)
Potassium: 3.8 mmol/L (ref 3.5–5.1)
Sodium: 137 mmol/L (ref 135–145)

## 2020-04-15 LAB — CULTURE, BLOOD (ROUTINE X 2)
Culture: NO GROWTH
Culture: NO GROWTH
Special Requests: ADEQUATE

## 2020-04-15 LAB — CBC
HCT: 28.4 % — ABNORMAL LOW (ref 39.0–52.0)
Hemoglobin: 9.4 g/dL — ABNORMAL LOW (ref 13.0–17.0)
MCH: 27.5 pg (ref 26.0–34.0)
MCHC: 33.1 g/dL (ref 30.0–36.0)
MCV: 83 fL (ref 80.0–100.0)
Platelets: 729 10*3/uL — ABNORMAL HIGH (ref 150–400)
RBC: 3.42 MIL/uL — ABNORMAL LOW (ref 4.22–5.81)
RDW: 15 % (ref 11.5–15.5)
WBC: 4 10*3/uL (ref 4.0–10.5)
nRBC: 0 % (ref 0.0–0.2)

## 2020-04-15 LAB — HIV-1 RNA QUANT-NO REFLEX-BLD
HIV 1 RNA Quant: 40 copies/mL
LOG10 HIV-1 RNA: 1.602 log10copy/mL

## 2020-04-15 LAB — MAGNESIUM: Magnesium: 1.9 mg/dL (ref 1.7–2.4)

## 2020-04-15 NOTE — Progress Notes (Signed)
° °   °  [address]       [place] [address]             [phone]       Procedure(s) (LRB): VIDEO ASSISTED THORACOSCOPY (VATS)/EMPYEMA (Left) DECORTICATION (Left) Subjective: No new concerns.  Resp status stable, denies pain.   Objective: Vital signs in last 24 hours: Temp:  [97.7 F (36.5 C)-98.1 F (36.7 C)] 97.7 F (36.5 C) (07/04 0746) Pulse Rate:  [61-76] 61 (07/04 0407) Cardiac Rhythm: Normal sinus rhythm;Bundle branch block (07/04 0715) Resp:  [18-20] 20 (07/04 0746) BP: (109-116)/(68-74) 109/70 (07/04 0746) SpO2:  [97 %-98 %] 97 % (07/04 0746)    Intake/Output from previous day: 07/03 0701 - 07/04 0700 In: -  Out: 100 [Urine:100] Intake/Output this shift: No intake/output data recorded.  General appearance: alert, cooperative and no distress Lungs: Breath sounds clear on right, improved but still diminished on left.  Minimal drainage from the left pigtail catheter.   Lab Results: Recent Labs    04/14/20 0405 04/15/20 0350  WBC 5.4 4.0  HGB 8.9* 9.4*  HCT 27.1* 28.4*  PLT 692* 729*   BMET:  Recent Labs    04/14/20 0405 04/15/20 0350  NA 135 137  K 3.9 3.8  CL 103 103  CO2 25 23  GLUCOSE 101* 91  BUN 22* 20  CREATININE 1.33* 1.34*  CALCIUM 7.9* 8.5*    PT/INR: No results for input(s): LABPROT, INR in the last 72 hours. ABG    Component Value Date/Time   PHART 7.482 (H) 09/16/2007 2002   HCO3 27.1 (H) 10/06/2007 1744   TCO2 28 04/09/2020 1831   ACIDBASEDEF 0.8 09/16/2007 2002   O2SAT 97.3 09/16/2007 2002   CBG (last 3)  Recent Labs    04/12/20 1513  GLUCAP 111*    EXAM: CHEST - 2 VIEW  COMPARISON:  04/14/2020  FINDINGS: Pigtail basilar left pleural drainage catheter unchanged. Lungs are adequately inflated with stable patchy opacification over the left mid to lower lung. Right lung is clear. Cardiomediastinal silhouette and remainder of the exam is unchanged.  IMPRESSION: Stable patchy  opacification over the left mid to lower lung likely residual loculated effusion and associated atelectasis. Left basilar pigtail pleural drainage catheter unchanged.   Electronically Signed   By: Elberta Fortis M.D.   On: 04/15/2020 08:53   Assessment/Plan: S/P Procedure(s) (LRB): VIDEO ASSISTED THORACOSCOPY (VATS)/EMPYEMA (Left) DECORTICATION (Left)  -significant improvement in left loculated post pneumonic pleural effusion treated with Dornase. Minimal drainage past 24 hours. CXR improving. OK to remove the chest tube. Will check f/u CXR in AM and if stable he may be discharged home from CT surgery stand point.   LOS: 6 days    Leary Roca, PA-C [phone] 04/15/2020

## 2020-04-15 NOTE — Evaluation (Signed)
Physical Therapy Evaluation and Discharge  Patient Details Name: Eric Gardner MRN: [id number] DOB: [birth year] Today's Date: 04/15/2020   History of Present Illness  Eric Gardner is an 59 y.o. male past medical history of HIV, previous stroke, chronic anemia admitted to [facility] about a week prior to admission for pneumonia which details are not available.  As per patient he was in the hospital 4 days and discharged on empiric antibiotics, following his discharge he started having left-sided chest pain which is persistent and worse with inspiration.  CT of the chest shows a loculated effusion concerning for empyema, S/p VATS, chest tube in place  Clinical Impression   Patient evaluated by Physical Therapy with no further acute PT needs identified. All education has been completed and the patient has no further questions. He is managing mobility and gait independently;  See below for any follow-up Physical Therapy or equipment needs. PT is signing off. Thank you for this referral.      Follow Up Recommendations Outpatient PT    Equipment Recommendations  None recommended by PT    Recommendations for Other Services       Precautions / Restrictions Precautions Precautions: Other (comment) Precaution Comments: chest tube      Mobility  Bed Mobility Overal bed mobility: Independent                Transfers Overall transfer level: Modified independent Equipment used: Rolling walker (2 wheeled)             General transfer comment: no difficulty  Ambulation/Gait Ambulation/Gait assistance: Supervision;Modified independent (Device/Increase time) Gait Distance (Feet): 400 Feet Assistive device: Rolling walker (2 wheeled);None;IV Pole Gait Pattern/deviations: Step-through pattern Gait velocity: initially slow, but even out with more time walking   General Gait Details: Initiated amb with RW, then progressed to no device, then pt holding the Pleurex, then pt  managing Pleurex and IV pole with walking; Overall no difficulty  Stairs            Wheelchair Mobility    Modified Rankin (Stroke Patients Only)       Balance Overall balance assessment: No apparent balance deficits (not formally assessed)                                           Pertinent Vitals/Pain Pain Assessment: Faces Faces Pain Scale: Hurts a little bit Pain Location: At chest tube entry site Pain Descriptors / Indicators: Grimacing;Guarding Pain Intervention(s): Monitored during session    Home Living Family/patient expects to be discharged to:: Private residence Living Arrangements: Alone   Type of Home: House Home Access: Stairs to enter Entrance Stairs-Rails: Left Entrance Stairs-Number of Steps: 4 Home Layout: Two level Home Equipment: None      Prior Function Level of Independence: Independent               Hand Dominance   Dominant Hand: Right    Extremity/Trunk Assessment   Upper Extremity Assessment Upper Extremity Assessment: Overall WFL for tasks assessed    Lower Extremity Assessment Lower Extremity Assessment: Overall WFL for tasks assessed;Generalized weakness       Communication   Communication: No difficulties  Cognition Arousal/Alertness: Awake/alert Behavior During Therapy: WFL for tasks assessed/performed Overall Cognitive Status: Within Functional Limits for tasks assessed  General Comments General comments (skin integrity, edema, etc.): No distress noted with amb on Room Air    Exercises     Assessment/Plan    PT Assessment All further PT needs can be met in the next venue of care  PT Problem List Decreased strength;Decreased activity tolerance       PT Treatment Interventions      PT Goals (Current goals can be found in the Care Plan section)  Acute Rehab PT Goals Patient Stated Goal: Hopes to get chest ube out soon PT Goal  Formulation: All assessment and education complete, DC therapy    Frequency     Barriers to discharge        Co-evaluation               AM-PAC PT "6 Clicks" Mobility  Outcome Measure Help needed turning from your back to your side while in a flat bed without using bedrails?: None Help needed moving from lying on your back to sitting on the side of a flat bed without using bedrails?: None Help needed moving to and from a bed to a chair (including a wheelchair)?: None Help needed standing up from a chair using your arms (e.g., wheelchair or bedside chair)?: None Help needed to walk in hospital room?: None Help needed climbing 3-5 steps with a railing? : A Little 6 Click Score: 23    End of Session   Activity Tolerance: Patient tolerated treatment well Patient left: in chair;with call bell/phone within reach Nurse Communication: Mobility status PT Visit Diagnosis: Other abnormalities of gait and mobility (R26.89)    Time: 9977-4142 PT Time Calculation (min) (ACUTE ONLY): 23 min   Charges:   PT Evaluation $PT Eval Low Complexity: 1 Low PT Treatments $Gait Training: 8-22 mins        Roney Marion, PT  Acute Rehabilitation Services Pager [phone] Office [place] 04/15/2020, 1:30 PM

## 2020-04-15 NOTE — Progress Notes (Signed)
[facility] PROGRESS NOTE    Progress Note  Eric Gardner  NFA:[id number] DOB: 03/24/1961 DOA: 04/09/2020 PCP: Yisroel Ramming, MD     Brief Narrative:   Eric Gardner is an 59 y.o. male past medical history of HIV, previous stroke, chronic anemia admitted to [facility] about a week prior to admission for pneumonia which details are not available.  As per patient he was in the hospital 4 days and discharged on empiric antibiotics, following his discharge he started having left-sided chest pain which is persistent and worse with inspiration.  CT of the chest shows a loculated effusion concerning for empyema, CT surgery was consulted  Assessment/Plan:   Acute respiratory distress secondary to left-sided empyema due to healthcare associated pneumonia: Continue empiric and antibiotics for total of 7 days. Appreciate CT surgery's assistance, loculated effusion is significantly improved. Strep pneumo urine antigen was positive.  Awaiting CT surgery recommendations.  HyperKalemia: Resolved.  HIV on antiretroviral therapy: Continue HAART therapy.  History of CVA: Resume Plavix  Ongoing tobacco abuse: Nicotine patch.  Anemia of chronic disease: Hemoglobin seems to be close to baseline.   DVT prophylaxis: lovenox Family Communication:none Status is: Inpatient  Remains inpatient appropriate because:Hemodynamically unstable   Dispo: The patient is from: Home              Anticipated d/c is to: Home              Anticipated d/c date is: 1 days              Patient currently: Is not medically stable CV surgery to dictate when to discharge home   Code Status:     Code Status Orders  (From admission, onward)         Start     Ordered   04/09/20 2222  Full code  Continuous        04/09/20 2222        Code Status History    This patient has a current code status but no historical code status.   Advance Care Planning Activity        IV Access:     Peripheral IV   Procedures and diagnostic studies:   DG Chest 2 View  Result Date: 04/15/2020 CLINICAL DATA:  Pleural effusion. EXAM: CHEST - 2 VIEW COMPARISON:  04/14/2020 FINDINGS: Pigtail basilar left pleural drainage catheter unchanged. Lungs are adequately inflated with stable patchy opacification over the left mid to lower lung. Right lung is clear. Cardiomediastinal silhouette and remainder of the exam is unchanged. IMPRESSION: Stable patchy opacification over the left mid to lower lung likely residual loculated effusion and associated atelectasis. Left basilar pigtail pleural drainage catheter unchanged. Electronically Signed   By: Elberta Fortis M.D.   On: 04/15/2020 08:53   DG CHEST PORT 1 VIEW  Result Date: 04/14/2020 CLINICAL DATA:  59 year old male with history of large left pleural effusion. EXAM: PORTABLE CHEST 1 VIEW COMPARISON:  Chest x-ray 04/13/2020. FINDINGS: Pigtail drainage catheter tip in the inferior aspect of the left hemithorax. Atelectasis and/or consolidation throughout the left mid to lower lung. Previously noted large left pleural effusion has decreased significantly in size and is currently small. No appreciable pneumothorax. Right lung is clear. No right pleural effusion. No evidence of pulmonary edema. Heart size is borderline enlarged. Upper mediastinal contours are within normal limits. IMPRESSION: 1. Left-sided chest tube appears similarly positioned with decreasing now small left pleural effusion revealing persistent areas of atelectasis and/or consolidation throughout the  left mid to lower lung. Electronically Signed   By: Trudie Reed M.D.   On: 04/14/2020 08:49     Medical Consultants:    None.  Anti-Infectives:   Only on IV Zosyn.  Subjective:    Eric Gardner pain resolved having a good appetite this morning good mood.  Objective:    Vitals:   04/14/20 2054 04/14/20 2200 04/15/20 0407 04/15/20 0746  BP: 116/68  114/74 109/70  Pulse:  76  61   Resp: 19 19 18 20   Temp: 98.1 F (36.7 C)  97.8 F (36.6 C) 97.7 F (36.5 C)  TempSrc: Oral  Oral Oral  SpO2: 98%  98% 97%  Weight:      Height:       SpO2: 97 % O2 Flow Rate (L/min): 2 L/min   Intake/Output Summary (Last 24 hours) at 04/15/2020 0914 Last data filed at 04/14/2020 0924 Gross per 24 hour  Intake --  Output 100 ml  Net -100 ml   Filed Weights   04/09/20 1712 04/09/20 1957  Weight: 55.8 kg 55.8 kg    Exam: General exam: In no acute distress. Respiratory system: Good air movement and clear to auscultation, chest tube in place.   Cardiovascular system: S1 & S2 heard, RRR. No JVD. Gastrointestinal system: Abdomen is nondistended, soft and nontender.  Extremities: No pedal edema. Skin: No rashes, lesions or ulcers Psychiatry: Judgement and insight appear normal. Mood & affect appropriate.   Data Reviewed:    Labs: Basic Metabolic Panel: Recent Labs  Lab 04/10/20 0500 04/10/20 0500 04/12/20 0225 04/12/20 0225 04/13/20 0335 04/13/20 0335 04/14/20 0405 04/15/20 0350  NA 136  --  135  --  134*  --  135 137  K 5.9*   < > 4.1   < > 4.4   < > 3.9 3.8  CL 104  --  103  --  101  --  103 103  CO2 24  --  26  --  23  --  25 23  GLUCOSE 98  --  118*  --  115*  --  101* 91  BUN 18  --  15  --  18  --  22* 20  CREATININE 1.12  --  1.27*  --  1.38*  --  1.33* 1.34*  CALCIUM 7.9*  --  8.1*  --  8.2*  --  7.9* 8.5*  MG  --   --  2.0  --  2.0  --  2.0 1.9   < > = values in this interval not displayed.   GFR Estimated Creatinine Clearance: 47.4 mL/min (A) (by C-G formula based on SCr of 1.34 mg/dL (H)). Liver Function Tests: No results for input(s): AST, ALT, ALKPHOS, BILITOT, PROT, ALBUMIN in the last 168 hours. No results for input(s): LIPASE, AMYLASE in the last 168 hours. No results for input(s): AMMONIA in the last 168 hours. Coagulation profile No results for input(s): INR, PROTIME in the last 168 hours. COVID-19 Labs  No results for input(s):  DDIMER, FERRITIN, LDH, CRP in the last 72 hours.  Lab Results  Component Value Date   SARSCOV2NAA NEGATIVE 04/09/2020    CBC: Recent Labs  Lab 04/10/20 0423 04/12/20 0225 04/13/20 0335 04/14/20 0405 04/15/20 0350  WBC 9.1 8.0 13.0* 5.4 4.0  HGB 10.6* 10.1* 10.5* 8.9* 9.4*  HCT 31.5* 30.6* 31.4* 27.1* 28.4*  MCV 83.8 82.3 82.2 82.9 83.0  PLT 548* 622* 723* 692* 729*   Cardiac Enzymes: No results  for input(s): CKTOTAL, CKMB, CKMBINDEX, TROPONINI in the last 168 hours. BNP (last 3 results) No results for input(s): PROBNP in the last 8760 hours. CBG: Recent Labs  Lab 04/12/20 1513  GLUCAP 111*   D-Dimer: No results for input(s): DDIMER in the last 72 hours. Hgb A1c: No results for input(s): HGBA1C in the last 72 hours. Lipid Profile: No results for input(s): CHOL, HDL, LDLCALC, TRIG, CHOLHDL, LDLDIRECT in the last 72 hours. Thyroid function studies: No results for input(s): TSH, T4TOTAL, T3FREE, THYROIDAB in the last 72 hours.  Invalid input(s): FREET3 Anemia work up: No results for input(s): VITAMINB12, FOLATE, FERRITIN, TIBC, IRON, RETICCTPCT in the last 72 hours. Sepsis Labs: Recent Labs  Lab 04/12/20 0225 04/13/20 0335 04/14/20 0405 04/15/20 0350  WBC 8.0 13.0* 5.4 4.0   Microbiology Recent Results (from the past 240 hour(s))  SARS Coronavirus 2 by RT PCR (hospital order, performed in [facility] hospital lab) Nasopharyngeal Nasopharyngeal Swab     Status: None   Collection Time: 04/09/20  9:38 PM   Specimen: Nasopharyngeal Swab  Result Value Ref Range Status   SARS Coronavirus 2 NEGATIVE NEGATIVE Final    Comment: (NOTE) SARS-CoV-2 target nucleic acids are NOT DETECTED.  The SARS-CoV-2 RNA is generally detectable in upper and lower respiratory specimens during the acute phase of infection. The lowest concentration of SARS-CoV-2 viral copies this assay can detect is 250 copies / mL. A negative result does not preclude SARS-CoV-2 infection and should  not be used as the sole basis for treatment or other patient management decisions.  A negative result may occur with improper specimen collection / handling, submission of specimen other than nasopharyngeal swab, presence of viral mutation(s) within the areas targeted by this assay, and inadequate number of viral copies (<250 copies / mL). A negative result must be combined with clinical observations, patient history, and epidemiological information.  Fact Sheet for Patients:   BoilerBrush.com.cyhttps://www.fda.gov/media/136312/download  Fact Sheet for Healthcare Providers: https://pope.com/https://www.fda.gov/media/136313/download  This test is not yet approved or  cleared by the [place] FDA and has been authorized for detection and/or diagnosis of SARS-CoV-2 by FDA under an Emergency Use Authorization (EUA).  This EUA will remain in effect (meaning this test can be used) for the duration of the COVID-19 declaration under Section 564(b)(1) of the Act, 21 U.S.C. section 360bbb-3(b)(1), unless the authorization is terminated or revoked sooner.  Performed at [facility], [address] [place], [place] [address]   Culture, blood (Routine X 2) w Reflex to ID Panel     Status: None (Preliminary result)   Collection Time: 04/09/20 11:50 PM   Specimen: BLOOD LEFT FOREARM  Result Value Ref Range Status   Specimen Description   Final    BLOOD LEFT FOREARM Performed at [facility], [address] [place], [place] [address]    Special Requests   Final    BOTTLES DRAWN AEROBIC AND ANAEROBIC Blood Culture adequate volume Performed at [facility], [address] [place], [place] [address]    Culture   Final    NO GROWTH 4 DAYS Performed at [facility], [address] [place], [place] [address]    Report Status PENDING  Incomplete  Culture, blood (Routine X 2) w Reflex to ID Panel     Status: None (Preliminary result)   Collection Time: 04/09/20 11:50 PM    Specimen: BLOOD  Result Value Ref Range Status   Specimen Description   Final  BLOOD LEFT UPPER ARM Performed at [facility], [address] [place], [place] [address]    Special Requests   Final    BOTTLES DRAWN AEROBIC AND ANAEROBIC Blood Culture results may not be optimal due to an excessive volume of blood received in culture bottles Performed at [facility], [address] [place], [place] [address]    Culture   Final    NO GROWTH 4 DAYS Performed at [facility], [address] [place], [place] [address]    Report Status PENDING  Incomplete  Aerobic/Anaerobic Culture (surgical/deep wound)     Status: None (Preliminary result)   Collection Time: 04/11/20  3:02 PM   Specimen: Wound; Abscess  Result Value Ref Range Status   Specimen Description WOUND LEFT CHEST  Final   Special Requests DRAIN CHEST TUBE  Final   Gram Stain NO WBC SEEN NO ORGANISMS SEEN   Final   Culture   Final    NO GROWTH 3 DAYS NO ANAEROBES ISOLATED; CULTURE IN PROGRESS FOR 5 DAYS Performed at [facility], [address] [place], [place] [address]    Report Status PENDING  Incomplete  MRSA PCR Screening     Status: None   Collection Time: 04/12/20  1:30 PM   Specimen: Nasal Mucosa; Nasopharyngeal  Result Value Ref Range Status   MRSA by PCR NEGATIVE NEGATIVE Final    Comment:        The GeneXpert MRSA Assay (FDA approved for NASAL specimens only), is one component of a comprehensive MRSA colonization surveillance program. It is not intended to diagnose MRSA infection nor to guide or monitor treatment for MRSA infections. Performed at [facility], [address] [place], [place] [address]      Medications:   . alteplase (TPA) for intrapleural administration  10 mg Intrapleural Once   And  . pulmozyme (DORNASE) for intrapleural administration  5 mg Intrapleural Once  . bictegravir-emtricitabine-tenofovir AF  1 tablet Oral Daily  .  clopidogrel  75 mg Oral Daily  . hydrochlorothiazide  25 mg Oral q morning - 10a   Continuous Infusions: . piperacillin-tazobactam (ZOSYN)  IV 3.375 g (04/15/20 0913)      LOS: 6 days   Marinda Elk  [facility]  04/15/2020, 9:14 AM

## 2020-04-16 ENCOUNTER — Inpatient Hospital Stay (HOSPITAL_COMMUNITY): Payer: Medicare Other

## 2020-04-16 LAB — AEROBIC/ANAEROBIC CULTURE W GRAM STAIN (SURGICAL/DEEP WOUND)
Culture: NO GROWTH
Gram Stain: NONE SEEN

## 2020-04-16 MED ORDER — POLYETHYLENE GLYCOL 3350 17 G PO PACK: 17.0000 g | PACK | Freq: Every day | ORAL | 0 refills | Status: AC | PRN

## 2020-04-16 MED ORDER — OXYCODONE HCL 10 MG PO TABS: 10.0000 mg | ORAL_TABLET | Freq: Four times a day (QID) | ORAL | 0 refills | Status: AC | PRN

## 2020-04-16 MED ORDER — LEVOFLOXACIN 750 MG PO TABS: 750.0000 mg | ORAL_TABLET | Freq: Every day | ORAL | 0 refills | Status: AC

## 2020-04-16 NOTE — TOC Initial Note (Signed)
Transition of Care [facility]) - Initial/Assessment Note    Patient Details  Name: Eric Gardner MRN: [id number] Date of Birth: 1961/07/11  Transition of [facility]) CM/SW Contact:    Doy Hutching, LCSW Phone Number:  04/16/2020, 12:32 PM  Clinical Narrative:                 CSW spoke with pt at bedside. Introduced self, role, reason for visit. Pt from [place], confirmed home address and PCP. He was here for the holiday visiting when he was admitted. Pt states he is planning on staying with his family for a few days before returning to [place]. He is okay with this Clinical research associate making PCP appointment at [facility] on [address]. He will have them arrange OPPT with area provider.   PCP appointment made for 7/9 at 9am. Cab voucher provided for pt to return to his family today. RN aware and given voucher. Info for PCP placed on AVS.  Expected Discharge Plan: OP Rehab Barriers to Discharge: No Barriers Identified   Patient Goals and CMS Choice Patient states their goals for this hospitalization and ongoing recovery are:: spend a few days here and then return to [facility] for follow up   Choice offered to / list presented to : Patient  Expected Discharge Plan and Services Expected Discharge Plan: OP Rehab In-house Referral: Clinical Social Work Discharge Planning Services: CM Consult, Follow-up appt scheduled Post Acute Care Choice:  (OP Rehab) Living arrangements for the past 2 months: Apartment Expected Discharge Date: 04/16/20                Prior Living Arrangements/Services Living arrangements for the past 2 months: Apartment Lives with:: Self Patient language and need for interpreter reviewed:: Yes (no needs) Do you feel safe going back to the place where you live?: Yes      Need for Family Participation in Patient Care: Yes (Comment) (assistance as needed) Care giver support system in place?: Yes (comment) (extended family)   Criminal Activity/Legal Involvement  Pertinent to Current Situation/Hospitalization: No - Comment as needed  Activities of Daily Living Home Assistive Devices/Equipment: Eyeglasses ADL Screening (condition at time of admission) Patient's cognitive ability adequate to safely complete daily activities?: Yes Is the patient deaf or have difficulty hearing?: No Does the patient have difficulty seeing, even when wearing glasses/contacts?: No Does the patient have difficulty concentrating, remembering, or making decisions?: No Patient able to express need for assistance with ADLs?: Yes Does the patient have difficulty dressing or bathing?: Yes Independently performs ADLs?: No Communication: Independent Dressing (OT): Needs assistance Is this a change from baseline?: Change from baseline, expected to last >3 days Grooming: Independent Feeding: Independent Bathing: Needs assistance Is this a change from baseline?: Change from baseline, expected to last >3 days Toileting: Needs assistance Is this a change from baseline?: Change from baseline, expected to last >3days In/Out Bed: Needs assistance Is this a change from baseline?: Change from baseline, expected to last >3 days Walks in Home: Needs assistance Is this a change from baseline?: Change from baseline, expected to last >3 days Does the patient have difficulty walking or climbing stairs?: Yes Weakness of Legs: Both Weakness of Arms/Hands: Both  Permission Sought/Granted Permission sought to share information with : PCP       Permission granted to share info w AGENCY: [facility])  Permission granted to share info w Contact Information: [phone]  Emotional Assessment Appearance:: Appears stated age Attitude/Demeanor/Rapport: Engaged Affect (typically observed): Accepting,  Adaptable, Appropriate Orientation: : Oriented to Self, Oriented to Place, Oriented to  Time, Oriented to Situation Alcohol / Substance Use: Tobacco Use Psych Involvement:   (n/a)  Admission diagnosis:  Empyema (HCC) [J86.9] Patient Active Problem List   Diagnosis Date Noted   Empyema (HCC) 04/09/2020   Community acquired pneumonia 04/09/2020   Loculated pleural effusion 04/09/2020   Cerebral infarction (HCC) 03/21/2012   HIV DISEASE 09/29/2007   PNEUMOCYSTIS PNEUMONIA 09/29/2007   MITRAL VALVE PROLAPSE 09/29/2007   PNEUMONIA, HX OF 09/29/2007   PCP:  Yisroel Ramming, MD Pharmacy:   [facility] - [place], [place] - [address] [place] [address] Phone: [phone] Fax: [phone]  Readmission Risk Interventions Readmission Risk Prevention Plan 04/16/2020  Post Dischage Appt Complete  Medication Screening Complete  Transportation Screening Complete  Some recent data might be hidden

## 2020-04-16 NOTE — Discharge Summary (Signed)
Physician Discharge Summary  CASMIR Eric Gardner JKK:[id number] DOB: August 31, 1961 DOA: 04/09/2020  PCP: Yisroel Ramming, MD  Admit date: 04/09/2020 Discharge date: 04/16/2020  Admitted From: Home Disposition:  Home  Recommendations for Outpatient Follow-up:  1. Follow up with CT in 1-2 weeks 2. Please obtain BMP/CBC in one week   Home Health:No Equipment/Devices:Eric Gardner  Discharge Condition:Stable CODE STATUS:fULL Diet recommendation: Heart Healthy  Brief/Interim Summary: 59 y.o. male past medical history of HIV, previous stroke, chronic anemia admitted to [facility] about a week prior to admission for pneumonia which details are not available.  As per patient he was in the hospital 4 days and discharged on empiric antibiotics, following his discharge he started having left-sided chest pain which is persistent and worse with inspiration.  CT of the chest shows a loculated effusion concerning for empyema, CT surgery was consulted  Discharge Diagnoses:  Principal Problem:   Loculated pleural effusion Active Problems:   HIV DISEASE   Cerebral infarction (HCC)   Empyema (HCC)   Community acquired pneumonia  Acute respiratory failure with hypoxia secondary to left-sided empyema due to healthcare associated pneumonia: He was started empirically on IV antibiotics, CT surgery was consulted who recommended chest tube placement with thrombolytics. There was complete resolution chest tube removed he will continue antibiotics for 1 additional day follow-up with CT surgery in 2 weeks.  Hyperkalemia: Replete orally now resolved.  HIV on [place] therapy: No changes made to his medication.  History of CVA: No changes made.  Anemia of chronic disease: Hemoglobin seems to be at baseline.  Discharge Instructions  Discharge Instructions    Diet - low sodium heart healthy   Complete by: As directed    Increase activity slowly   Complete by: As directed    No wound care   Complete by: As directed       Allergies as of 04/16/2020      Reactions   Atorvastatin Other (See Comments)   Unknown      Medication List    TAKE these medications   Biktarvy 50-200-25 MG Tabs tablet Generic drug: bictegravir-emtricitabine-tenofovir AF Take 1 tablet by mouth daily.   clopidogrel 75 MG tablet Commonly known as: PLAVIX Take 75 mg by mouth daily.   diclofenac Sodium 1 % Gel Commonly known as: VOLTAREN Apply topically.   efavirenz-emtricitabine-tenofovir 600-200-300 MG tablet Commonly known as: ATRIPLA Take 1 tablet by mouth at bedtime.   gabapentin 300 MG capsule Commonly known as: NEURONTIN Take 300 mg by mouth daily as needed (nerve pain).   hydrochlorothiazide 25 MG tablet Commonly known as: HYDRODIURIL Take 25 mg by mouth every morning.   hydrOXYzine 25 MG capsule Commonly known as: VISTARIL Take 25 mg by mouth 2 (two) times daily as needed.   levofloxacin 750 MG tablet Commonly known as: LEVAQUIN Take 1 tablet (750 mg total) by mouth daily.   meloxicam 7.5 MG tablet Commonly known as: MOBIC Take 7.5 mg by mouth 2 (two) times daily as needed.   Oxycodone HCl 10 MG Tabs Take 1 tablet (10 mg total) by mouth every 6 (six) hours as needed for severe pain.   polyethylene glycol 17 g packet Commonly known as: MIRALAX / GLYCOLAX Take 17 g by mouth daily as needed for moderate constipation or severe constipation.   Thera-Tabs M Tabs Take 1 tablet by mouth daily.   Vitamin D (Ergocalciferol) 1.25 MG (50000 UNIT) Caps capsule Commonly known as: DRISDOL Take 50,000 Units by mouth 2 (two) times a week.  Allergies  Allergen Reactions  . Atorvastatin Other (See Comments)    Unknown    Consultations:  CT surgery   Procedures/Studies: DG Chest 2 View  Result Date: 04/15/2020 CLINICAL DATA:  Pleural effusion. EXAM: CHEST - 2 VIEW COMPARISON:  04/14/2020 FINDINGS: Pigtail basilar left pleural drainage catheter unchanged. Lungs are adequately inflated with  stable patchy opacification over the left mid to lower lung. Right lung is clear. Cardiomediastinal silhouette and remainder of the exam is unchanged. IMPRESSION: Stable patchy opacification over the left mid to lower lung likely residual loculated effusion and associated atelectasis. Left basilar pigtail pleural drainage catheter unchanged. Electronically Signed   By: Elberta Fortis M.D.   On: 04/15/2020 08:53   DG Chest 2 View  Result Date: 04/09/2020 CLINICAL DATA:  Chest pain. Additional provided: Shortness of breath and chest pain for 3-4 days, patient reports recent stroke and pneumonia, HIV, smoker, hypertension. EXAM: CHEST - 2 VIEW COMPARISON:  Prior chest radiographs 03/21/2012. FINDINGS: Heart size within normal limits. There is abnormal opacity throughout much of the left lung field with associated left-sided volume loss and air lucency outlining the aortic knob. Findings likely reflect significant left upper and lower lobe atelectasis with hyperexpansion of the superior segment of the left lower lobe. Probable underlying airspace consolidation within the left lower lobe. The right lung is clear. No definite pleural effusion or evidence of pneumothorax. No acute bony abnormality identified. These results were called by telephone at the time of interpretation on 04/09/2020 at 6:05 pm to provider DAN FLOYD , who verbally acknowledged these results. IMPRESSION: Abnormal opacity throughout much of the left lung field with associated left-sided volume loss. Findings likely reflect significant partial collapse of the left upper and lower lobes. Probable underlying airspace consolidation within the left lower lobe. Contrast-enhanced chest CT is recommended for further evaluation and to exclude a central obstructing mass. The right lung is clear. Electronically Signed   By: Jackey Loge DO   On: 04/09/2020 18:06   CT Angio Chest PE W and/or Wo Contrast  Result Date: 04/09/2020 CLINICAL DATA:  Shortness of  breath and chest pain. Stroke and pneumonia. EXAM: CT ANGIOGRAPHY CHEST WITH CONTRAST TECHNIQUE: Multidetector CT imaging of the chest was performed using the standard protocol during bolus administration of intravenous contrast. Multiplanar CT image reconstructions and MIPs were obtained to evaluate the vascular anatomy. CONTRAST:  OMNIPAQUE IOHEXOL 350 MG/ML SOLN COMPARISON:  Chest radiography same day FINDINGS: Cardiovascular: Pulmonary arterial opacification is good. There are no pulmonary emboli. Mediastinum/Nodes: No mass or lymphadenopathy. Lungs/Pleura: On the left, there is a loculated pleural effusion in the posterior chest with associated compressive atelectasis of the posterior left lung. The non compressed lung appears clear. On the right, there is mild dependent atelectasis. The patient does have some emphysematous change in the lungs, more towards the inferior lungs than the superior lungs. Upper Abdomen: Negative Musculoskeletal: Normal.  Pectus configuration of the chest. Review of the MIP images confirms the above findings. IMPRESSION: No pulmonary emboli. Large loculated pleural fluid collection in the left posterior chest suggesting possible empyema. Compressive atelectasis of the posterior adjacent left lung. Aerated lung does not show obvious infiltrate. Emphysematous changes, basilar predominant. Emphysema (ICD10-J43.9). Electronically Signed   By: Paulina Fusi M.D.   On: 04/09/2020 20:09   DG CHEST PORT 1 VIEW  Result Date: 04/14/2020 CLINICAL DATA:  59 year old male with history of large left pleural effusion. EXAM: PORTABLE CHEST 1 VIEW COMPARISON:  Chest x-ray 04/13/2020. FINDINGS: Pigtail  drainage catheter tip in the inferior aspect of the left hemithorax. Atelectasis and/or consolidation throughout the left mid to lower lung. Previously noted large left pleural effusion has decreased significantly in size and is currently small. No appreciable pneumothorax. Right lung is clear.  No right pleural effusion. No evidence of pulmonary edema. Heart size is borderline enlarged. Upper mediastinal contours are within normal limits. IMPRESSION: 1. Left-sided chest tube appears similarly positioned with decreasing now small left pleural effusion revealing persistent areas of atelectasis and/or consolidation throughout the left mid to lower lung. Electronically Signed   By: Trudie Reed M.D.   On: 04/14/2020 08:49   DG CHEST PORT 1 VIEW  Result Date: 04/13/2020 CLINICAL DATA:  Left pleural effusion. EXAM: PORTABLE CHEST 1 VIEW COMPARISON:  04/12/2020.  CT 04/11/2020, 04/09/2020. FINDINGS: Left chest tube in stable position. Persistent large left pleural effusion. Underlying atelectatic changes/infiltrate left lung. Right lung is clear. No pneumothorax. Cardiomegaly. No pulmonary venous congestion. Thoracic spine scoliosis. IMPRESSION: 1. Left chest tube in stable position. Persistent large left pleural effusion. Underlying atelectatic changes/infiltrate left lung. 2.  Cardiomegaly.  No pulmonary venous congestion. Electronically Signed   By: Maisie Fus  Register   On: 04/13/2020 06:42   DG CHEST PORT 1 VIEW  Result Date: 04/12/2020 CLINICAL DATA:  Chest tube placement, left pleural effusion EXAM: PORTABLE CHEST 1 VIEW COMPARISON:  04/11/2020 FINDINGS: Single frontal view of the chest demonstrates left chest tube coiled over left lateral lung base. There is persistent left pleural effusion and left lung consolidation not appreciably changed since the procedural examination. No pneumothorax. Right chest is clear. IMPRESSION: 1. Stable left chest tube, with persistent left effusion and consolidation. Electronically Signed   By: Sharlet Salina M.D.   On: 04/12/2020 20:39   CT IMAGE GUIDED DRAINAGE BY PERCUTANEOUS CATHETER  Result Date: 04/11/2020 INDICATION: History of HIV, now admitted with pneumonia and loculated pleural effusion. Request made for CT-guided placement of a left-sided chest tube  for infection source control purposes. EXAM: CT IMAGE GUIDED DRAINAGE BY PERCUTANEOUS CATHETER COMPARISON:  Chest CT-04/09/2020 MEDICATIONS: The patient is currently admitted to the hospital and receiving intravenous antibiotics. The antibiotics were administered within an appropriate time frame prior to the initiation of the procedure. ANESTHESIA/SEDATION: Moderate (conscious) sedation was employed during this procedure. A total of Versed 100 mg and Fentanyl 2 mcg was administered intravenously. Moderate Sedation Time: 18 minutes. The patient's level of consciousness and vital signs were monitored continuously by radiology nursing throughout the procedure under my direct supervision. CONTRAST:  Eric Gardner COMPLICATIONS: Eric Gardner immediate. PROCEDURE: Informed written consent was obtained from the patient after a discussion of the risks, benefits and alternatives to treatment. The patient was placed supine, slightly RPO on the CT gantry and a pre procedural CT was performed re-demonstrating the known moderate to large-sized partially loculated left-sided pleural effusion. The procedure was planned. A timeout was performed prior to the initiation of the procedure. The skin overlying the inferolateral aspect of the left hemithorax was prepped and draped in the usual sterile fashion. The overlying soft tissues were anesthetized with 1% lidocaine with epinephrine. Appropriate trajectory was planned with the use of a 22 gauge spinal needle. An 18 gauge trocar needle was advanced into the abscess/fluid collection and a short Amplatz super stiff wire was coiled within the collection. Appropriate positioning was confirmed with a limited CT scan. The tract was serially dilated allowing placement of a 12 [place] all-purpose drainage catheter. Appropriate positioning was confirmed with a limited postprocedural CT scan.  Approximately 250 ml of serous fluid was aspirated. The tube was connected to a pleura vac device and sutured in place.  A dressing was placed. The patient tolerated the procedure well without immediate post procedural complication. IMPRESSION: Successful CT guided placement of a 58 French all purpose drain catheter into the basilar aspect of the left pleural space with aspiration of 250 mL of serous fluid. Samples were sent to the laboratory as requested by the ordering clinical team. Electronically Signed   By: Simonne Come M.D.   On: 04/11/2020 17:31    (Echo, Carotid, EGD, Colonoscopy, ERCP)    Subjective: No complaints.  Discharge Exam: Vitals:   04/15/20 2100 04/16/20 0755  BP: 108/65 120/73  Pulse: 61 (!) 57  Resp: 19   Temp: 98 F (36.7 C) 98 F (36.7 C)  SpO2: 99% 99%   Vitals:   04/15/20 0746 04/15/20 1658 04/15/20 2100 04/16/20 0755  BP: 109/70 120/68 108/65 120/73  Pulse:  64 61 (!) 57  Resp: 20 19 19    Temp: 97.7 F (36.5 C) 97.6 F (36.4 C) 98 F (36.7 C) 98 F (36.7 C)  TempSrc: Oral  Oral Oral  SpO2: 97% 98% 99% 99%  Weight:      Height:        General: Pt is alert, awake, not in acute distress Cardiovascular: RRR, S1/S2 +, no rubs, no gallops Respiratory: CTA bilaterally, no wheezing, no rhonchi Abdominal: Soft, NT, ND, bowel sounds + Extremities: no edema, no cyanosis    The results of significant diagnostics from this hospitalization (including imaging, microbiology, ancillary and laboratory) are listed below for reference.     Microbiology: Recent Results (from the past 240 hour(s))  SARS Coronavirus 2 by RT PCR (hospital order, performed in [facility] hospital lab) Nasopharyngeal Nasopharyngeal Swab     Status: Eric Gardner   Collection Time: 04/09/20  9:38 PM   Specimen: Nasopharyngeal Swab  Result Value Ref Range Status   SARS Coronavirus 2 NEGATIVE NEGATIVE Final    Comment: (NOTE) SARS-CoV-2 target nucleic acids are NOT DETECTED.  The SARS-CoV-2 RNA is generally detectable in upper and lower respiratory specimens during the acute phase of infection. The  lowest concentration of SARS-CoV-2 viral copies this assay can detect is 250 copies / mL. A negative result does not preclude SARS-CoV-2 infection and should not be used as the sole basis for treatment or other patient management decisions.  A negative result may occur with improper specimen collection / handling, submission of specimen other than nasopharyngeal swab, presence of viral mutation(s) within the areas targeted by this assay, and inadequate number of viral copies (<250 copies / mL). A negative result must be combined with clinical observations, patient history, and epidemiological information.  Fact Sheet for Patients:   04/11/20  Fact Sheet for Healthcare Providers: BoilerBrush.com.cy  This test is not yet approved or  cleared by the https://pope.com/ FDA and has been authorized for detection and/or diagnosis of SARS-CoV-2 by FDA under an Emergency Use Authorization (EUA).  This EUA will remain in effect (meaning this test can be used) for the duration of the COVID-19 declaration under Section 564(b)(1) of the Act, 21 U.S.C. section 360bbb-3(b)(1), unless the authorization is terminated or revoked sooner.  Performed at [facility], [address], [place], [place]   Culture, blood (Routine X 2) w Reflex to ID Panel     Status: Eric Gardner   Collection Time: 04/09/20 11:50 PM   Specimen: BLOOD LEFT FOREARM  Result Value Ref Range Status   Specimen Description   Final    BLOOD LEFT FOREARM Performed at [facility], [address] [place], [place] [address]    Special Requests   Final    BOTTLES DRAWN AEROBIC AND ANAEROBIC Blood Culture adequate volume Performed at [facility], [address] [place], [place] [address]    Culture   Final    NO GROWTH 5 DAYS Performed at [facility], [address] [place], [place] [address]    Report Status 04/15/2020  FINAL  Final  Culture, blood (Routine X 2) w Reflex to ID Panel     Status: Eric Gardner   Collection Time: 04/09/20 11:50 PM   Specimen: BLOOD  Result Value Ref Range Status   Specimen Description   Final    BLOOD LEFT UPPER ARM Performed at [facility], [address] [place], [place] [address]    Special Requests   Final    BOTTLES DRAWN AEROBIC AND ANAEROBIC Blood Culture results may not be optimal due to an excessive volume of blood received in culture bottles Performed at [facility], [address] [place], [place] [address]    Culture   Final    NO GROWTH 5 DAYS Performed at [facility], [address] [place], [place] [address]    Report Status 04/15/2020 FINAL  Final  Aerobic/Anaerobic Culture (surgical/deep wound)     Status: Eric Gardner   Collection Time: 04/11/20  3:02 PM   Specimen: Wound; Abscess  Result Value Ref Range Status   Specimen Description WOUND LEFT CHEST  Final   Special Requests DRAIN CHEST TUBE  Final   Gram Stain NO WBC SEEN NO ORGANISMS SEEN   Final   Culture   Final    No growth aerobically or anaerobically. Performed at [facility], [address] [place], [place] [address]    Report Status 04/16/2020 FINAL  Final  MRSA PCR Screening     Status: Eric Gardner   Collection Time: 04/12/20  1:30 PM   Specimen: Nasal Mucosa; Nasopharyngeal  Result Value Ref Range Status   MRSA by PCR NEGATIVE NEGATIVE Final    Comment:        The GeneXpert MRSA Assay (FDA approved for NASAL specimens only), is one component of a comprehensive MRSA colonization surveillance program. It is not intended to diagnose MRSA infection nor to guide or monitor treatment for MRSA infections. Performed at [facility], [address] [place], [place] [address]      Labs: BNP (last 3 results) No results for input(s): BNP in the last 8760 hours. Basic Metabolic Panel: Recent Labs  Lab 04/10/20 0500 04/12/20 0225 04/13/20 0335  04/14/20 0405 04/15/20 0350  NA 136 135 134* 135 137  K 5.9* 4.1 4.4 3.9 3.8  CL 104 103 101 103 103  CO2 24 26 23 25 23   GLUCOSE 98 118* 115* 101* 91  BUN 18 15 18  22* 20  CREATININE 1.12 1.27* 1.38* 1.33* 1.34*  CALCIUM 7.9* 8.1* 8.2* 7.9* 8.5*  MG  --  2.0 2.0 2.0 1.9   Liver Function Tests: No results for input(s): AST, ALT, ALKPHOS, BILITOT, PROT, ALBUMIN in the last 168 hours. No results for input(s): LIPASE, AMYLASE in the last 168 hours. No results for input(s): AMMONIA in the last 168 hours. CBC: Recent Labs  Lab 04/10/20 0423 04/12/20 0225 04/13/20 0335 04/14/20 0405 04/15/20 0350  WBC 9.1 8.0 13.0* 5.4 4.0  HGB 10.6* 10.1* 10.5* 8.9* 9.4*  HCT 31.5* 30.6* 31.4* 27.1* 28.4*  MCV 83.8 82.3 82.2 82.9 83.0  PLT 548* 622* 723* 692* 729*   Cardiac Enzymes: No results for input(s): CKTOTAL, CKMB, CKMBINDEX, TROPONINI in the last 168 hours. BNP: Invalid input(s): POCBNP CBG: Recent Labs  Lab 04/12/20 1513  GLUCAP 111*   D-Dimer No results for input(s): DDIMER in the last 72 hours. Hgb A1c No results for input(s): HGBA1C in the last 72 hours. Lipid Profile No results for input(s): CHOL, HDL, LDLCALC, TRIG, CHOLHDL, LDLDIRECT in the last 72 hours. Thyroid function studies No results for input(s): TSH, T4TOTAL, T3FREE, THYROIDAB in the last 72 hours.  Invalid input(s): FREET3 Anemia work up No results for input(s): VITAMINB12, FOLATE, FERRITIN, TIBC, IRON, RETICCTPCT in the last 72 hours. Urinalysis    Component Value Date/Time   COLORURINE AMBER BIOCHEMICALS MAY BE AFFECTED BY COLOR (A) 07/30/2009 2309   APPEARANCEUR CLEAR 07/30/2009 2309   LABSPEC >=1.030 01/05/2011 1654   PHURINE 6.0 01/05/2011 1654   GLUCOSEU NEGATIVE 01/05/2011 1654   HGBUR LARGE (A) 01/05/2011 1654   BILIRUBINUR NEGATIVE 01/05/2011 1654   KETONESUR NEGATIVE 01/05/2011 1654   PROTEINUR >=300 (A) 01/05/2011 1654   UROBILINOGEN 0.2 01/05/2011 1654   NITRITE NEGATIVE 01/05/2011 1654    LEUKOCYTESUR (A) 01/05/2011 1654    SMALL Biochemical Testing Only. Please order routine urinalysis from main lab if confirmatory testing is needed.   Sepsis Labs Invalid input(s): PROCALCITONIN,  WBC,  LACTICIDVEN Microbiology Recent Results (from the past 240 hour(s))  SARS Coronavirus 2 by RT PCR (hospital order, performed in [facility] hospital lab) Nasopharyngeal Nasopharyngeal Swab     Status: Eric Gardner   Collection Time: 04/09/20  9:38 PM   Specimen: Nasopharyngeal Swab  Result Value Ref Range Status   SARS Coronavirus 2 NEGATIVE NEGATIVE Final    Comment: (NOTE) SARS-CoV-2 target nucleic acids are NOT DETECTED.  The SARS-CoV-2 RNA is generally detectable in upper and lower respiratory specimens during the acute phase of infection. The lowest concentration of SARS-CoV-2 viral copies this assay can detect is 250 copies / mL. A negative result does not preclude SARS-CoV-2 infection and should not be used as the sole basis for treatment or other patient management decisions.  A negative result may occur with improper specimen collection / handling, submission of specimen other than nasopharyngeal swab, presence of viral mutation(s) within the areas targeted by this assay, and inadequate number of viral copies (<250 copies / mL). A negative result must be combined with clinical observations, patient history, and epidemiological information.  Fact Sheet for Patients:   BoilerBrush.com.cy  Fact Sheet for Healthcare Providers: https://pope.com/  This test is not yet approved or  cleared by the [place] FDA and has been authorized for detection and/or diagnosis of SARS-CoV-2 by FDA under an Emergency Use Authorization (EUA).  This EUA will remain in effect (meaning this test can be used) for the duration of the COVID-19 declaration under Section 564(b)(1) of the Act, 21 U.S.C. section 360bbb-3(b)(1), unless the authorization is  terminated or revoked sooner.  Performed at [facility], [address], [place], [place] [address]   Culture, blood (Routine X 2) w Reflex to ID Panel     Status: Eric Gardner   Collection Time: 04/09/20 11:50 PM   Specimen: BLOOD LEFT FOREARM  Result Value Ref Range Status   Specimen Description   Final    BLOOD LEFT FOREARM Performed  at [facility], [address], [place], [place] [address]    Special Requests   Final    BOTTLES DRAWN AEROBIC AND ANAEROBIC Blood Culture adequate volume Performed at [facility], [address], [place], [place] [address]    Culture   Final    NO GROWTH 5 DAYS Performed at [facility], [address] [place], [place] [address]    Report Status 04/15/2020 FINAL  Final  Culture, blood (Routine X 2) w Reflex to ID Panel     Status: Eric Gardner   Collection Time: 04/09/20 11:50 PM   Specimen: BLOOD  Result Value Ref Range Status   Specimen Description   Final    BLOOD LEFT UPPER ARM Performed at [facility], [address], [place], [place] [address]    Special Requests   Final    BOTTLES DRAWN AEROBIC AND ANAEROBIC Blood Culture results may not be optimal due to an excessive volume of blood received in culture bottles Performed at [facility], [address], [place], [place] [address]    Culture   Final    NO GROWTH 5 DAYS Performed at [facility], [address], [place], [place] [address]    Report Status 04/15/2020 FINAL  Final  Aerobic/Anaerobic Culture (surgical/deep wound)     Status: Eric Gardner   Collection Time: 04/11/20  3:02 PM   Specimen: Wound; Abscess  Result Value Ref Range Status   Specimen Description WOUND LEFT CHEST  Final   Special Requests DRAIN CHEST TUBE  Final   Gram Stain NO WBC SEEN NO ORGANISMS SEEN   Final   Culture   Final    No growth aerobically or anaerobically. Performed at [facility], [address], [place], [place]  [address]    Report Status 04/16/2020 FINAL  Final  MRSA PCR Screening     Status: Eric Gardner   Collection Time: 04/12/20  1:30 PM   Specimen: Nasal Mucosa; Nasopharyngeal  Result Value Ref Range Status   MRSA by PCR NEGATIVE NEGATIVE Final    Comment:        The GeneXpert MRSA Assay (FDA approved for NASAL specimens only), is one component of a comprehensive MRSA colonization surveillance program. It is not intended to diagnose MRSA infection nor to guide or monitor treatment for MRSA infections. Performed at [facility], [address] [place], [place] [address]      Time coordinating discharge: Over 30 minutes  SIGNED:   Marinda Elk, MD  [facility] 04/16/2020, 12:14 PM Pager   If 7PM-7AM, please contact night-coverage [email or web address] Password [email or web address]

## 2020-04-16 NOTE — Progress Notes (Addendum)
      [address]       [place] [address]             [phone]       Subjective: Rested better last night. Denies shortness of breath. No new concerns.   Objective: Vital signs in last 24 hours: Temp:  [97.6 F (36.4 C)-98 F (36.7 C)] 98 F (36.7 C) (07/05 0755) Pulse Rate:  [57-64] 57 (07/05 0755) Cardiac Rhythm: Sinus bradycardia (07/05 0700) Resp:  [19] 19 (07/04 2100) BP: (108-120)/(65-73) 120/73 (07/05 0755) SpO2:  [98 %-99 %] 99 % (07/05 0755)  Intake/Output from previous day: 07/04 0701 - 07/05 0700 In: -  Out: 250 [Urine:250] Intake/Output this shift: No intake/output data recorded.  General appearance: alert, cooperative and no distress Lungs: Breath sounds clear bilat. The pigtail catheter was removed yesterday. The insertion site has and intact and secure occlusive dressing in place.  No signs of drainage or subQ air.  The PA/Lat CXR this am shows no significant re-accumulation of left pleural fluid. No PTX. The patchy opacifications over the left lower and lateral lung zones are stable.    Lab Results: Recent Labs    04/14/20 0405 04/15/20 0350  WBC 5.4 4.0  HGB 8.9* 9.4*  HCT 27.1* 28.4*  PLT 692* 729*   BMET:  Recent Labs    04/14/20 0405 04/15/20 0350  NA 135 137  K 3.9 3.8  CL 103 103  CO2 25 23  GLUCOSE 101* 91  BUN 22* 20  CREATININE 1.33* 1.34*  CALCIUM 7.9* 8.5*    PT/INR: No results for input(s): LABPROT, INR in the last 72 hours. ABG    Component Value Date/Time   PHART 7.482 (H) 09/16/2007 2002   HCO3 27.1 (H) 10/06/2007 1744   TCO2 28 04/09/2020 1831   ACIDBASEDEF 0.8 09/16/2007 2002   O2SAT 97.3 09/16/2007 2002   CBG (last 3)  No results for input(s): GLUCAP in the last 72 hours.  Assessment/Plan:  -Complex loculated post-pneumonic left pleural effusion with near complete resolution following intrapleural lytics. CXR stable post CT removal yesterday. OK to discharge from CT surgery  standpoint.  He is asked to leave the occlusive dressing in place and dry for another 24 hours.  Will plan to see Mr. Pehl in the office next week. Our office will contact him with appointment details.     LOS: 7 days    Leary Roca, [place] [phone] 04/16/2020   Agree with above CXR much improved Stable following CT removal Clear for discharge from a surgical standpoint  Corliss Skains

## 2020-04-17 LAB — T-HELPER CELLS (CD4) COUNT (NOT AT ARMC)

## 2020-04-18 ENCOUNTER — Other Ambulatory Visit: Payer: Self-pay | Admitting: Thoracic Surgery (Cardiothoracic Vascular Surgery)

## 2020-04-18 DIAGNOSIS — J90 Pleural effusion, not elsewhere classified: Secondary | ICD-10-CM

## 2020-04-27 ENCOUNTER — Ambulatory Visit: Payer: Medicare Other | Admitting: Thoracic Surgery (Cardiothoracic Vascular Surgery)

## 2020-04-30 ENCOUNTER — Encounter: Payer: Self-pay | Admitting: Thoracic Surgery (Cardiothoracic Vascular Surgery)
